# Patient Record
Sex: Male | Born: 1970 | Race: White | Hispanic: No | Marital: Married | Smoking: Former smoker
Health system: Southern US, Community
[De-identification: ages and names within clinical notes are randomized; demographics above are authoritative.]

## PROBLEM LIST (undated history)

## (undated) DIAGNOSIS — L509 Urticaria, unspecified: Secondary | ICD-10-CM

## (undated) DIAGNOSIS — J302 Other seasonal allergic rhinitis: Secondary | ICD-10-CM

## (undated) DIAGNOSIS — R519 Headache, unspecified: Secondary | ICD-10-CM

## (undated) DIAGNOSIS — E538 Deficiency of other specified B group vitamins: Secondary | ICD-10-CM

## (undated) DIAGNOSIS — Z87828 Personal history of other (healed) physical injury and trauma: Secondary | ICD-10-CM

## (undated) DIAGNOSIS — L309 Dermatitis, unspecified: Secondary | ICD-10-CM

## (undated) DIAGNOSIS — C61 Malignant neoplasm of prostate: Secondary | ICD-10-CM

## (undated) HISTORY — DX: Headache, unspecified: R51.9

## (undated) HISTORY — PX: OTHER SURGICAL HISTORY: SHX169

## (undated) HISTORY — PX: PROSTATE BIOPSY: SHX241

## (undated) HISTORY — DX: Deficiency of other specified B group vitamins: E53.8

## (undated) HISTORY — DX: Other seasonal allergic rhinitis: J30.2

## (undated) HISTORY — PX: HEMORRHOID SURGERY: SHX153

## (undated) HISTORY — DX: Personal history of other (healed) physical injury and trauma: Z87.828

## (undated) HISTORY — DX: Urticaria, unspecified: L50.9

## (undated) HISTORY — DX: Dermatitis, unspecified: L30.9

## (undated) NOTE — *Deleted (*Deleted)
 CC: Prostate Cancer    Ryan Manning is a 49 year old gentleman who was found to have an elevated PSA of 21.9 prompting a TRUS biopsy of the prostate by Dr. Winter on 12/21/19. This confirmed Gleason 3+4=7 adenocarcinoma with 1 out of 12 biopsy cores positive for malignancy.   Family history: None.   Imaging studies:  CT abdomen/pelvis (01/25/20): Negative for metastatic disease.  Bone scan (01/24/20): Negative for metastatic disease.   PMH: He has a history of GERD and anxiety.  PSH: Laparoscopic appendectomy   TNM stage: cT2a N0 M0 (L apex)  PSA: 21.9  Gleason score: 3+4=7 (GG 2)  Biopsy (12/21/19): 1/12 cores positive  Left: L lateral apex (20%, 3+4=7)  Right: Benign  Prostate volume: 22.2 cc   Nomogram  OC disease: 55%  EPE: 39%  SVI: 5%  LNI: 3%  PFS (5 year, 10 year): 73%, 58%   Urinary function: IPSS is 2.  Erectile function: SHIM score is 25.     ALLERGIES: None   MEDICATIONS: Hydrocodone-Acetaminophen 5 mg-325 mg tablet 1 tablet PO As Directed Take one hour prior to your scheduled prostate biopsy  Levaquin 750 mg tablet 1 tablet PO Daily As Directed Start taking the day before, the day of and the day after your prostate biopsy  Vitamin B12     GU PSH: Locm 300-399Mg/Ml Iodine,1Ml - 01/25/2020 Prostate Needle Biopsy - 12/21/2019     NON-GU PSH: Appendectomy Surgical Pathology, Gross And Microscopic Examination For Prostate Needle - 12/21/2019     GU PMH: Elevated PSA - 01/25/2020, - 10/24/2019    NON-GU PMH: Anxiety GERD Hypercholesterolemia    FAMILY HISTORY: Kidney Failure - Father Prostate Cancer - Father   SOCIAL HISTORY: Marital Status: Married Preferred Language: English; Race: White Current Smoking Status: Patient smokes. Smokes 1/2 pack per day.   Tobacco Use Assessment Completed: Used Tobacco in last 30 days? Drinks 12 drinks per week.  Drinks 3 caffeinated drinks per day.    REVIEW OF SYSTEMS:    GU Review Male:   Patient denies trouble  starting your streams, hard to postpone urination, burning/ pain with urination, frequent urination, have to strain to urinate , get up at night to urinate, stream starts and stops, and leakage of urine.  Gastrointestinal (Upper):   Patient denies nausea and vomiting.  Gastrointestinal (Lower):   Patient denies diarrhea and constipation.  Constitutional:   Patient denies fever, night sweats, weight loss, and fatigue.  Skin:   Patient denies skin rash/ lesion and itching.  Eyes:   Patient denies blurred vision and double vision.  Ears/ Nose/ Throat:   Patient denies sore throat and sinus problems.  Hematologic/Lymphatic:   Patient denies swollen glands and easy bruising.  Cardiovascular:   Patient denies leg swelling and chest pains.  Respiratory:   Patient denies cough and shortness of breath.  Endocrine:   Patient denies excessive thirst.  Musculoskeletal:   Patient denies back pain and joint pain.  Neurological:   Patient denies headaches and dizziness.  Psychologic:   Patient denies depression and anxiety.   VITAL SIGNS:     Weight 155 lb / 70.31 kg  Height 71 in / 180.34 cm  BMI 21.6 kg/m     MULTI-SYSTEM PHYSICAL EXAMINATION:    Constitutional: Well-nourished. No physical deformities. Normally developed. Good grooming.  Neck: Neck symmetrical, not swollen. Normal tracheal position.  Respiratory: No labored breathing, no use of accessory muscles. Clear bilaterally.  Cardiovascular: Normal temperature, normal extremity pulses,   no swelling, no varicosities. Regular rate and rhythm.  Lymphatic: No enlargement of neck, axillae, groin.  Skin: No paleness, no jaundice, no cyanosis. No lesion, no ulcer, no rash.  Neurologic / Psychiatric: Oriented to time, oriented to place, oriented to person. No depression, no anxiety, no agitation.  Gastrointestinal: No mass, no tenderness, no rigidity, non obese abdomen.  Eyes: Normal conjunctivae. Normal eyelids.  Ears, Nose, Mouth, and Throat:  Left ear no scars, no lesions, no masses. Right ear no scars, no lesions, no masses. Nose no scars, no lesions, no masses. Normal hearing. Normal lips.  Musculoskeletal: Normal gait and station of head and neck.     Complexity of Data:  Lab Test Review:   PSA  Records Review:   Pathology Reports, Previous Patient Records  X-Ray Review: C.T. Abdomen/Pelvis: Reviewed Films.  Bone Scan: Reviewed Films.     10/24/19  PSA  Total PSA 21.90 ng/mL   Notes:                     CLINICAL DATA: Prostate cancer diagnosed 12/21/2019. Elevated PSA.  Smoker. Appendectomy.   EXAM:  CT ABDOMEN AND PELVIS WITH CONTRAST   TECHNIQUE:  Multidetector CT imaging of the abdomen and pelvis was performed  using the standard protocol following bolus administration of  intravenous contrast.   CONTRAST: 100 cc Omnipaque 300   COMPARISON: 06/10/2016.   FINDINGS:  Lower chest: Clear lung bases. Normal heart size without pericardial  or pleural effusion.   Hepatobiliary: Enlargement of a segment 2-3 2.2 cm hepatic cyst.  Other hepatic lesions are too small to characterize but similar.  Normal gallbladder, without biliary ductal dilatation.   Pancreas: Normal, without mass or ductal dilatation.   Spleen: Normal in size, without focal abnormality.   Adrenals/Urinary Tract: Normal adrenal glands. Normal kidneys,  without hydronephrosis. Normal urinary bladder.   Stomach/Bowel: Proximal gastric underdistention. Normal colon and  terminal ileum. Appendectomy. Normal small bowel.   Vascular/Lymphatic: Aortic atherosclerosis. No abdominopelvic  adenopathy.   Reproductive: Normal sized prostate. Symmetric seminal vesicles.  Nonspecific subtle heterogeneous hyperenhancement in the left apex  on 73/2.   Other: Tiny periumbilical fat containing ventral wall hernia.   Musculoskeletal: No acute osseous abnormality. Transitional S1  vertebral body.   IMPRESSION:  1. No acute process or evidence of  metastatic disease in the abdomen  or pelvis.  2. Aortic Atherosclerosis (ICD10-I70.0).    Electronically Signed  By: Kyle Talbot M.D.  On: 01/25/2020 16:53   CLINICAL DATA: Prostate carcinoma   EXAM:  NUCLEAR MEDICINE WHOLE BODY BONE SCAN   TECHNIQUE:  Whole body anterior and posterior images were obtained approximately  3 hours after intravenous injection of radiopharmaceutical.   RADIOPHARMACEUTICALS: 21.3 millicurie mCi Technetium-99m MDP IV   COMPARISON: None.   FINDINGS:  There is no evidence of bony metastatic disease. There is probable  arthropathic change in the shoulders. Kidneys noted in the flank  positions bilaterally.   IMPRESSION:  No evident bony metastatic disease.    Electronically Signed  By: William Woodruff III M.D.  On: 01/26/2020 11:13   PROCEDURES:          Urinalysis Dipstick Dipstick Cont'd  Color: Yellow Bilirubin: Neg mg/dL  Appearance: Clear Ketones: Neg mg/dL  Specific Gravity: 1.030 Blood: Neg ery/uL  pH: 5.5 Protein: Neg mg/dL  Glucose: Neg mg/dL Urobilinogen: 0.2 mg/dL    Nitrites: Neg    Leukocyte Esterase: Neg leu/uL    ASSESSMENT:      ICD-10   Details  1 GU:   Prostate Cancer - C61    PLAN:             1. High-risk prostate cancer: I had a detailed discussion with Ryan Manning he today. He has numerous very good questions today and we reviewed his situation in detail including his risk stratification and his options for management. I did recommend therapy of curative intent considering his high risk disease. We specifically discussed his elevated PSA. This had been repeated and remained elevated. He understands that we do not have a great explanation for his PSA. We also discussed his exam which does raise some additional concerns today with nodularity toward the left lateral apex.   The patient was counseled about the natural history of prostate cancer and the standard treatment options that are available for prostate cancer.  It was explained to him how his age and life expectancy, clinical stage, Gleason score, and PSA affect his prognosis, the decision to proceed with additional staging studies, as well as how that information influences recommended treatment strategies. We discussed the roles for active surveillance, radiation therapy, surgical therapy, androgen deprivation, as well as ablative therapy options for the treatment of prostate cancer as appropriate to his individual cancer situation. We discussed the risks and benefits of these options with regard to their impact on cancer control and also in terms of potential adverse events, complications, and impact on quality of life particularly related to urinary and sexual function. The patient was encouraged to ask questions throughout the discussion today and all questions were answered to his stated satisfaction. In addition, the patient was provided with and/or directed to appropriate resources and literature for further education about prostate cancer and treatment options. We discussed surgical therapy for prostate cancer including the different available surgical approaches. We discussed, in detail, the risks and expectations of surgery with regard to cancer control, urinary control, and erectile function as well as the expected postoperative recovery process. Additional risks of surgery including but not limited to bleeding, infection, hernia formation, nerve damage, lymphocele formation, bowel/rectal injury potentially necessitating colostomy, damage to the urinary tract resulting in urine leakage, urethral stricture, and the cardiopulmonary risks such as myocardial infarction, stroke, death, venothromboembolism, etc. were explained. The risk of open surgical conversion for robotic/laparoscopic prostatectomy was also discussed.   He is well informed through his prior discussion with Dr. Manning and is going to follow up with Dr. Winter to review his options. He appears  to be leaning toward surgical therapy at this time.  Tentatively, I would plan to perform robot assisted laparoscopic radical prostatectomy and bilateral pelvic lymphadenectomy with decisions regarding nerve sparing pending his MRI should he choose to proceed in this fashion.   Cc: Dr. Lisa Miller  Dr. Christopher Winter  Dr. Matthew Manning          APPENDED NOTES:  Pt has made the decision to proceed with surgery. Will plan to schedule.      

---

## 2016-06-10 ENCOUNTER — Emergency Department (HOSPITAL_COMMUNITY): Payer: Self-pay | Admitting: Certified Registered Nurse Anesthetist

## 2016-06-10 ENCOUNTER — Encounter (HOSPITAL_BASED_OUTPATIENT_CLINIC_OR_DEPARTMENT_OTHER): Payer: Self-pay

## 2016-06-10 ENCOUNTER — Encounter (HOSPITAL_COMMUNITY)
Admission: EM | Disposition: A | Discharge status: Home Health | Payer: Self-pay | Source: Home / Self Care | Attending: Emergency Medicine

## 2016-06-10 ENCOUNTER — Observation Stay (HOSPITAL_BASED_OUTPATIENT_CLINIC_OR_DEPARTMENT_OTHER)
Admission: EM | Admit: 2016-06-10 | Discharge: 2016-06-11 | Disposition: A | Discharge status: Home Health | Payer: Self-pay | Attending: Surgery | Admitting: Surgery

## 2016-06-10 ENCOUNTER — Emergency Department (HOSPITAL_BASED_OUTPATIENT_CLINIC_OR_DEPARTMENT_OTHER): Payer: Self-pay

## 2016-06-10 DIAGNOSIS — K353 Acute appendicitis with localized peritonitis: Principal | ICD-10-CM | POA: Insufficient documentation

## 2016-06-10 DIAGNOSIS — Z720 Tobacco use: Secondary | ICD-10-CM | POA: Insufficient documentation

## 2016-06-10 DIAGNOSIS — K3580 Unspecified acute appendicitis: Secondary | ICD-10-CM

## 2016-06-10 DIAGNOSIS — K37 Unspecified appendicitis: Secondary | ICD-10-CM | POA: Diagnosis present

## 2016-06-10 DIAGNOSIS — F1290 Cannabis use, unspecified, uncomplicated: Secondary | ICD-10-CM | POA: Insufficient documentation

## 2016-06-10 DIAGNOSIS — K381 Appendicular concretions: Secondary | ICD-10-CM | POA: Insufficient documentation

## 2016-06-10 HISTORY — PX: LAPAROSCOPIC APPENDECTOMY: SHX408

## 2016-06-10 LAB — COMPREHENSIVE METABOLIC PANEL
ALT: 21 U/L (ref 17–63)
AST: 21 U/L (ref 15–41)
Albumin: 4.2 g/dL (ref 3.5–5.0)
Alkaline Phosphatase: 60 U/L (ref 38–126)
Anion gap: 4 — ABNORMAL LOW (ref 5–15)
BUN: 16 mg/dL (ref 6–20)
CO2: 31 mmol/L (ref 22–32)
Calcium: 9.1 mg/dL (ref 8.9–10.3)
Chloride: 103 mmol/L (ref 101–111)
Creatinine, Ser: 0.92 mg/dL (ref 0.61–1.24)
GFR calc Af Amer: >60 mL/min (ref >60)
GFR calc non Af Amer: >60 mL/min (ref >60)
Glucose, Bld: 87 mg/dL (ref 65–99)
Potassium: 4.5 mmol/L (ref 3.5–5.1)
Sodium: 138 mmol/L (ref 135–145)
Total Bilirubin: 0.5 mg/dL (ref 0.3–1.2)
Total Protein: 7.6 g/dL (ref 6.5–8.1)

## 2016-06-10 LAB — CBC
HCT: 47.1 % (ref 39.0–52.0)
Hemoglobin: 15.8 g/dL (ref 13.0–17.0)
MCH: 33 pg (ref 26.0–34.0)
MCHC: 33.5 g/dL (ref 30.0–36.0)
MCV: 98.3 fL (ref 78.0–100.0)
Platelets: 231 10*3/uL (ref 150–400)
RBC: 4.79 MIL/uL (ref 4.22–5.81)
RDW: 13.4 % (ref 11.5–15.5)
WBC: 11 10*3/uL — ABNORMAL HIGH (ref 4.0–10.5)

## 2016-06-10 LAB — URINALYSIS, ROUTINE W REFLEX MICROSCOPIC
Bilirubin Urine: NEGATIVE
Glucose, UA: NEGATIVE mg/dL
Hgb urine dipstick: NEGATIVE
Ketones, ur: NEGATIVE mg/dL
Leukocytes, UA: NEGATIVE
Nitrite: NEGATIVE
Protein, ur: NEGATIVE mg/dL
Specific Gravity, Urine: 1.024 (ref 1.005–1.030)
pH: 6 (ref 5.0–8.0)

## 2016-06-10 LAB — LIPASE, BLOOD: Lipase: 24 U/L (ref 11–51)

## 2016-06-10 SURGERY — APPENDECTOMY, LAPAROSCOPIC
Anesthesia: General

## 2016-06-10 MED ORDER — HEPARIN SODIUM (PORCINE) 5000 UNIT/ML IJ SOLN
5000.0000 [IU] | Freq: Three times a day (TID) | INTRAMUSCULAR | Status: DC
  Administered 2016-06-10 – 2016-06-11 (×2): 5000 [IU] via SUBCUTANEOUS
  Filled 2016-06-10 (×2): qty 1

## 2016-06-10 MED ORDER — MIDAZOLAM HCL 2 MG/2ML IJ SOLN
INTRAMUSCULAR | Status: AC
  Filled 2016-06-10: qty 2

## 2016-06-10 MED ORDER — PROPOFOL 10 MG/ML IV BOLUS
INTRAVENOUS | Status: DC | PRN
  Administered 2016-06-10: 200 mg via INTRAVENOUS

## 2016-06-10 MED ORDER — ROCURONIUM BROMIDE 10 MG/ML (PF) SYRINGE
PREFILLED_SYRINGE | INTRAVENOUS | Status: DC | PRN
  Administered 2016-06-10: 40 mg via INTRAVENOUS

## 2016-06-10 MED ORDER — PHENYLEPHRINE 40 MCG/ML (10ML) SYRINGE FOR IV PUSH (FOR BLOOD PRESSURE SUPPORT)
PREFILLED_SYRINGE | INTRAVENOUS | Status: DC | PRN
  Administered 2016-06-10: 40 ug via INTRAVENOUS
  Administered 2016-06-10: 80 ug via INTRAVENOUS

## 2016-06-10 MED ORDER — PIPERACILLIN-TAZOBACTAM 3.375 G IVPB 30 MIN
3.3750 g | Freq: Once | INTRAVENOUS | Status: AC
  Administered 2016-06-10: 3.375 g via INTRAVENOUS
  Filled 2016-06-10: qty 50

## 2016-06-10 MED ORDER — LACTATED RINGERS IV SOLN
INTRAVENOUS | Status: DC | PRN
  Administered 2016-06-10 (×2): via INTRAVENOUS

## 2016-06-10 MED ORDER — ROCURONIUM BROMIDE 50 MG/5ML IV SOSY
PREFILLED_SYRINGE | INTRAVENOUS | Status: AC
  Filled 2016-06-10: qty 5

## 2016-06-10 MED ORDER — HYDROCODONE-ACETAMINOPHEN 5-325 MG PO TABS: 1.0000 | ORAL_TABLET | ORAL | Status: DC | PRN

## 2016-06-10 MED ORDER — IOPAMIDOL (ISOVUE-300) INJECTION 61%
100.0000 mL | Freq: Once | INTRAVENOUS | Status: AC | PRN
  Administered 2016-06-10: 100 mL via INTRAVENOUS

## 2016-06-10 MED ORDER — PROMETHAZINE HCL 25 MG/ML IJ SOLN: 6.2500 mg | INTRAMUSCULAR | Status: DC | PRN

## 2016-06-10 MED ORDER — ONDANSETRON HCL 4 MG/2ML IJ SOLN
INTRAMUSCULAR | Status: DC | PRN
Start: 2016-06-10 — End: 2016-06-10
  Administered 2016-06-10: 4 mg via INTRAVENOUS

## 2016-06-10 MED ORDER — SUGAMMADEX SODIUM 200 MG/2ML IV SOLN
INTRAVENOUS | Status: DC | PRN
  Administered 2016-06-10: 200 mg via INTRAVENOUS

## 2016-06-10 MED ORDER — HYDROMORPHONE HCL 1 MG/ML IJ SOLN
INTRAMUSCULAR | Status: AC
  Filled 2016-06-10: qty 1

## 2016-06-10 MED ORDER — KETOROLAC TROMETHAMINE 30 MG/ML IJ SOLN
30.0000 mg | Freq: Once | INTRAMUSCULAR | Status: AC | PRN
  Administered 2016-06-10: 30 mg via INTRAVENOUS

## 2016-06-10 MED ORDER — PIPERACILLIN-TAZOBACTAM 3.375 G IVPB
3.3750 g | Freq: Three times a day (TID) | INTRAVENOUS | Status: DC
  Administered 2016-06-11: 3.375 g via INTRAVENOUS
  Filled 2016-06-10: qty 50

## 2016-06-10 MED ORDER — BUPIVACAINE HCL (PF) 0.25 % IJ SOLN
INTRAMUSCULAR | Status: AC
  Filled 2016-06-10: qty 30

## 2016-06-10 MED ORDER — MIDAZOLAM HCL 5 MG/5ML IJ SOLN
INTRAMUSCULAR | Status: DC | PRN
  Administered 2016-06-10: 2 mg via INTRAVENOUS

## 2016-06-10 MED ORDER — LACTATED RINGERS IR SOLN
Status: DC | PRN
  Administered 2016-06-10: 1000 mL

## 2016-06-10 MED ORDER — SODIUM CHLORIDE 0.9 % IV BOLUS (SEPSIS)
1000.0000 mL | Freq: Once | INTRAVENOUS | Status: AC
  Administered 2016-06-10: 1000 mL via INTRAVENOUS

## 2016-06-10 MED ORDER — FENTANYL CITRATE (PF) 100 MCG/2ML IJ SOLN
INTRAMUSCULAR | Status: DC | PRN
  Administered 2016-06-10: 100 ug via INTRAVENOUS
  Administered 2016-06-10: 50 ug via INTRAVENOUS

## 2016-06-10 MED ORDER — LIDOCAINE 2% (20 MG/ML) 5 ML SYRINGE
INTRAMUSCULAR | Status: AC
  Filled 2016-06-10: qty 5

## 2016-06-10 MED ORDER — SUCCINYLCHOLINE CHLORIDE 200 MG/10ML IV SOSY
PREFILLED_SYRINGE | INTRAVENOUS | Status: AC
  Filled 2016-06-10: qty 10

## 2016-06-10 MED ORDER — KETOROLAC TROMETHAMINE 30 MG/ML IJ SOLN
INTRAMUSCULAR | Status: AC
  Filled 2016-06-10: qty 1

## 2016-06-10 MED ORDER — PIPERACILLIN-TAZOBACTAM 4.5 G IVPB
4.5000 g | Freq: Once | INTRAVENOUS | Status: DC
  Filled 2016-06-10: qty 100

## 2016-06-10 MED ORDER — ONDANSETRON HCL 4 MG/2ML IJ SOLN
INTRAMUSCULAR | Status: AC
  Filled 2016-06-10: qty 2

## 2016-06-10 MED ORDER — PIPERACILLIN-TAZOBACTAM 3.375 G IVPB
INTRAVENOUS | Status: AC
  Administered 2016-06-10: 3.375 g via INTRAVENOUS
  Filled 2016-06-10: qty 50

## 2016-06-10 MED ORDER — KCL IN DEXTROSE-NACL 20-5-0.45 MEQ/L-%-% IV SOLN
INTRAVENOUS | Status: DC
  Administered 2016-06-10: via INTRAVENOUS
  Filled 2016-06-10 (×2): qty 1000

## 2016-06-10 MED ORDER — MORPHINE SULFATE (PF) 2 MG/ML IV SOLN: 1.0000 mg | INTRAVENOUS | Status: DC | PRN

## 2016-06-10 MED ORDER — HYDROMORPHONE HCL 1 MG/ML IJ SOLN
0.2500 mg | INTRAMUSCULAR | Status: DC | PRN
  Administered 2016-06-10 (×2): 0.5 mg via INTRAVENOUS

## 2016-06-10 MED ORDER — SUCCINYLCHOLINE CHLORIDE 20 MG/ML IJ SOLN
INTRAMUSCULAR | Status: DC | PRN
  Administered 2016-06-10: 120 mg via INTRAVENOUS

## 2016-06-10 MED ORDER — BUPIVACAINE HCL 0.25 % IJ SOLN
INTRAMUSCULAR | Status: DC | PRN
  Administered 2016-06-10: 20 mL

## 2016-06-10 MED ORDER — PROPOFOL 10 MG/ML IV BOLUS
INTRAVENOUS | Status: AC
  Filled 2016-06-10: qty 20

## 2016-06-10 MED ORDER — PIPERACILLIN-TAZOBACTAM 3.375 G IVPB
INTRAVENOUS | Status: AC
  Filled 2016-06-10: qty 50

## 2016-06-10 MED ORDER — SUGAMMADEX SODIUM 200 MG/2ML IV SOLN
INTRAVENOUS | Status: AC
  Filled 2016-06-10: qty 2

## 2016-06-10 MED ORDER — LIDOCAINE 2% (20 MG/ML) 5 ML SYRINGE
INTRAMUSCULAR | Status: DC | PRN
  Administered 2016-06-10: 50 mg via INTRAVENOUS

## 2016-06-10 MED ORDER — PIPERACILLIN-TAZOBACTAM 3.375 G IVPB 30 MIN
3.3750 g | Freq: Once | INTRAVENOUS | Status: AC
  Administered 2016-06-10: 3.375 g via INTRAVENOUS

## 2016-06-10 MED ORDER — ONDANSETRON HCL 4 MG/2ML IJ SOLN: 4.0000 mg | Freq: Four times a day (QID) | INTRAMUSCULAR | Status: DC | PRN

## 2016-06-10 MED ORDER — ONDANSETRON 4 MG PO TBDP: 4.0000 mg | ORAL_TABLET | Freq: Four times a day (QID) | ORAL | Status: DC | PRN

## 2016-06-10 MED ORDER — FENTANYL CITRATE (PF) 100 MCG/2ML IJ SOLN
INTRAMUSCULAR | Status: AC
  Filled 2016-06-10: qty 2

## 2016-06-10 SURGICAL SUPPLY — 34 items
APPLIER CLIP ROT 10 11.4 M/L (STAPLE)
CABLE HIGH FREQUENCY MONO STRZ (ELECTRODE) IMPLANT
CLIP APPLIE ROT 10 11.4 M/L (STAPLE) IMPLANT
COVER SURGICAL LIGHT HANDLE (MISCELLANEOUS) ×3 IMPLANT
CUTTER FLEX LINEAR 45M (STAPLE) ×3 IMPLANT
DRAPE LAPAROSCOPIC ABDOMINAL (DRAPES) ×3 IMPLANT
ELECT REM PT RETURN 9FT ADLT (ELECTROSURGICAL) ×3
ELECTRODE REM PT RTRN 9FT ADLT (ELECTROSURGICAL) ×1 IMPLANT
ENDOLOOP SUT PDS II  0 18 (SUTURE)
ENDOLOOP SUT PDS II 0 18 (SUTURE) IMPLANT
GLOVE BIOGEL M 8.0 STRL (GLOVE) ×3 IMPLANT
GOWN STRL REUS W/TWL XL LVL3 (GOWN DISPOSABLE) ×3 IMPLANT
IRRIG SUCT STRYKERFLOW 2 WTIP (MISCELLANEOUS) ×3
IRRIGATION SUCT STRKRFLW 2 WTP (MISCELLANEOUS) ×1 IMPLANT
KIT BASIN OR (CUSTOM PROCEDURE TRAY) ×3 IMPLANT
LIQUID BAND (GAUZE/BANDAGES/DRESSINGS) ×3 IMPLANT
POUCH RETRIEVAL ECOSAC 10 (ENDOMECHANICALS) IMPLANT
POUCH RETRIEVAL ECOSAC 10MM (ENDOMECHANICALS)
POUCH SPECIMEN RETRIEVAL 10MM (ENDOMECHANICALS) IMPLANT
RELOAD 45 VASCULAR/THIN (ENDOMECHANICALS) ×6 IMPLANT
RELOAD STAPLE TA45 3.5 REG BLU (ENDOMECHANICALS) IMPLANT
SCISSORS LAP 5X45 EPIX DISP (ENDOMECHANICALS) IMPLANT
SHEARS HARMONIC ACE PLUS 45CM (MISCELLANEOUS) ×3 IMPLANT
SLEEVE XCEL OPT CAN 5 100 (ENDOMECHANICALS) IMPLANT
STAPLER VISISTAT 35W (STAPLE) IMPLANT
SUT VIC AB 4-0 SH 18 (SUTURE) ×3 IMPLANT
SUT VICRYL 0 UR6 27IN ABS (SUTURE) ×3 IMPLANT
TOWEL OR 17X26 10 PK STRL BLUE (TOWEL DISPOSABLE) ×3 IMPLANT
TRAY FOLEY W/METER SILVER 16FR (SET/KITS/TRAYS/PACK) ×3 IMPLANT
TRAY LAPAROSCOPIC (CUSTOM PROCEDURE TRAY) ×3 IMPLANT
TROCAR BLADELESS OPT 5 100 (ENDOMECHANICALS) ×3 IMPLANT
TROCAR XCEL BLUNT TIP 100MML (ENDOMECHANICALS) ×3 IMPLANT
TROCAR XCEL NON-BLD 11X100MML (ENDOMECHANICALS) IMPLANT
TUBING INSUF HEATED (TUBING) ×3 IMPLANT

## 2016-06-10 NOTE — ED Notes (Signed)
Pt d/c via POV to the [facility] ED for surgical consult. Pt verbalized understanding of where to go. Pt instructed not to tamper with IV, not to make any additional stops, and not to eat or drink.

## 2016-06-10 NOTE — Anesthesia Preprocedure Evaluation (Signed)
Anesthesia Evaluation  Patient identified by MRN, date of birth, ID band Patient awake    Reviewed: Allergy & Precautions, NPO status , Patient's Chart, lab work & pertinent test results  Airway Mallampati: II  TM Distance: >3 FB Neck ROM: Full    Dental  (+) Dental Advisory Given   Pulmonary Current Smoker,    breath sounds clear to auscultation       Cardiovascular negative cardio ROS   Rhythm:Regular Rate:Normal     Neuro/Psych negative neurological ROS     GI/Hepatic Neg liver ROS, Acute appendicitis.   Endo/Other  negative endocrine ROS  Renal/GU negative Renal ROS     Musculoskeletal   Abdominal   Peds  Hematology negative hematology ROS (+)   Anesthesia Other Findings   Reproductive/Obstetrics                             Anesthesia Physical Anesthesia Plan  ASA: II and emergent  Anesthesia Plan: General   Post-op Pain Management:    Induction: Intravenous and Rapid sequence  Airway Management Planned: Oral ETT  Additional Equipment:   Intra-op Plan:   Post-operative Plan:   Informed Consent: I have reviewed the patients History and Physical, chart, labs and discussed the procedure including the risks, benefits and alternatives for the proposed anesthesia with the patient or authorized representative who has indicated his/her understanding and acceptance.   Dental advisory given  Plan Discussed with: CRNA  Anesthesia Plan Comments:         Anesthesia Quick Evaluation

## 2016-06-10 NOTE — Anesthesia Procedure Notes (Signed)
Procedure Name: Intubation Performed by: Gean Maidens Pre-anesthesia Checklist: Patient identified, Emergency Drugs available, Suction available, Patient being monitored and Timeout performed Patient Re-evaluated:Patient Re-evaluated prior to inductionOxygen Delivery Method: Circle system utilized Preoxygenation: Pre-oxygenation with 100% oxygen Intubation Type: IV induction Ventilation: Mask ventilation without difficulty Laryngoscope Size: Mac and 3 Grade View: Grade I Tube type: Oral Tube size: 7.5 mm Number of attempts: 1 Airway Equipment and Method: Stylet Placement Confirmation: ETT inserted through vocal cords under direct vision,  positive ETCO2,  CO2 detector and breath sounds checked- equal and bilateral Secured at: 23 cm Tube secured with: Tape Dental Injury: Teeth and Oropharynx as per pre-operative assessment

## 2016-06-10 NOTE — ED Provider Notes (Signed)
Patient accepted in transfer from Dr. Canary Brim at outside facility. Patient found to have appendicitis at outside facility. Patient transferred to see Dr. Hassell Done with general surgery.  Upon arrival, Dr. Hassell Done evaluated the patient.  Dr. Hassell Done will take the patient directly to surgery and admit after. Pt left to OR without complication in the ED.   Clinical Impression: 1. Acute appendicitis, unspecified acute appendicitis type     Disposition: Admit to Surgery after OR     Courtney Paris, MD 06/11/16 1140

## 2016-06-10 NOTE — Op Note (Addendum)
Surgeon: Kaylyn Lim, MD, FACS  Asst:  none  Anes:  general  Preop Dx: appendicitis Postop Dx: Acute appendicitis-not ruptured  Procedure: Laparoscopic appendectomy Location Surgery: [facility] Rm 2 Complications: None   EBL:   5 cc  Drains: none  Description of Procedure:  The patient was taken to OR 2 .  After anesthesia was administered and the patient was prepped a timeout was performed.  Access achieved with umbilical Hasson placement.  Two five mm trocars placed in the right upper quadrant and the left lower quadrant.  Slight purulent drainage in the area of the cecum.    The mesentery of the appendix was divided with a Harmonic scalpel.  The base was isolated and transected with two applications of the Endostapler 4.5 with vascular load.  The appendix was placed in a bag and brought out through the umbilicus. The umbilical port was closed with a fig of 8 of 0 vicryl.    Ports were injected with Marcaine.  Prior to closing the area was irrigated and inspected and no bleeding was noted.  The fluid was aspirated from the pelvis and over the liver.  The gallbladder was distended and robin's egg blue.  Pneumo released.   Incisions were closed with Vicryl and Dermabond.    The patient tolerated the procedure well and was taken to the PACU in stable condition.     Matt B. Hassell Done, [place], [facility], [place]

## 2016-06-10 NOTE — Transfer of Care (Signed)
Immediate Anesthesia Transfer of Care Note  Patient: Ryan Manning  Procedure(s) Performed: Procedure(s): APPENDECTOMY LAPAROSCOPIC (N/A)  Patient Location: PACU  Anesthesia Type:General  Level of Consciousness: awake, alert  and oriented  Airway & Oxygen Therapy: Patient Spontanous Breathing and Patient connected to face mask oxygen  Post-op Assessment: Report given to RN and Post -op Vital signs reviewed and stable  Post vital signs: Reviewed and stable  Last Vitals:  Vitals:   06/10/16 1719 06/10/16 1857  BP: 127/95 117/81  Pulse: 73 98  Resp: 18 14  Temp:  36.8 C    Last Pain:  Vitals:   06/10/16 1857  TempSrc: Oral  PainSc: 2          Complications: No apparent anesthesia complications

## 2016-06-10 NOTE — H&P (Signed)
Chief Complaint:  Right lower quadrant pain  History of Present Illness:  Ryan Manning is an 46 y.o. male who just moved here from [place], [place].  He presented to [facility] with 2 day history of gradually worsening abdominal pain localizing in the right lower quadrant.  CT showed appendicolith and acute appendicitis  History reviewed. No pertinent past medical history.  Past Surgical History:  Procedure Laterality Date  . HEMORRHOID SURGERY      No current facility-administered medications for this encounter.    No current outpatient prescriptions on file.   Patient has no known allergies. No family history on file. Social History:   reports that he has been smoking.  He has never used smokeless tobacco. He reports that he drinks alcohol. He reports that he uses drugs, including Marijuana.   REVIEW OF SYSTEMS : Negative except hemorroidectomy  Physical Exam:   Blood pressure 117/81, pulse 98, temperature 98.2 F (36.8 C), temperature source Oral, resp. rate 14, height 5' 10" (1.778 m), weight 73 kg (161 lb), SpO2 98 %. Body mass index is 23.1 kg/m.  Gen:  WDWN WM NAD  Neurological: Alert and oriented to person, place, and time. Motor and sensory function is grossly intact  Head: Normocephalic and atraumatic.  Eyes: Conjunctivae are normal. Pupils are equal, round, and reactive to light. No scleral icterus.  Neck: Normal range of motion. Neck supple. No tracheal deviation or thyromegaly present.  Cardiovascular:  SR without murmurs or gallops.  No carotid bruits Breast:  Not examined Respiratory: Effort normal.  No respiratory distress. No chest wall tenderness. Breath sounds normal.  No wheezes, rales or rhonchi.  Abdomen:  Right lower quadrant abdominal pain GU:  unremarkable Musculoskeletal: Normal range of motion. Extremities are nontender. No cyanosis, edema or clubbing noted Lymphadenopathy: No cervical, preauricular, postauricular or axillary adenopathy is  present Skin: Skin is warm and dry. No rash noted. No diaphoresis. No erythema. No pallor. Pscyh: Normal mood and affect. Behavior is normal. Judgment and thought content normal.   LABORATORY RESULTS: Results for orders placed or performed during the hospital encounter of 06/10/16 (from the past 48 hour(s))  Lipase, blood     Status: None   Collection Time: 06/10/16  3:30 PM  Result Value Ref Range   Lipase 24 11 - 51 U/L  Comprehensive metabolic panel     Status: Abnormal   Collection Time: 06/10/16  3:30 PM  Result Value Ref Range   Sodium 138 135 - 145 mmol/L   Potassium 4.5 3.5 - 5.1 mmol/L   Chloride 103 101 - 111 mmol/L   CO2 31 22 - 32 mmol/L   Glucose, Bld 87 65 - 99 mg/dL   BUN 16 6 - 20 mg/dL   Creatinine, Ser 0.92 0.61 - 1.24 mg/dL   Calcium 9.1 8.9 - 10.3 mg/dL   Total Protein 7.6 6.5 - 8.1 g/dL   Albumin 4.2 3.5 - 5.0 g/dL   AST 21 15 - 41 U/L   ALT 21 17 - 63 U/L   Alkaline Phosphatase 60 38 - 126 U/L   Total Bilirubin 0.5 0.3 - 1.2 mg/dL   GFR calc non Af Amer >60 >60 mL/min   GFR calc Af Amer >60 >60 mL/min    Comment: (NOTE) The eGFR has been calculated using the CKD EPI equation. This calculation has not been validated in all clinical situations. eGFR's persistently <60 mL/min signify possible Chronic Kidney Disease.    Anion gap 4 (L) 5 -  15  CBC     Status: Abnormal   Collection Time: 06/10/16  3:30 PM  Result Value Ref Range   WBC 11.0 (H) 4.0 - 10.5 K/uL   RBC 4.79 4.22 - 5.81 MIL/uL   Hemoglobin 15.8 13.0 - 17.0 g/dL   HCT 47.1 39.0 - 52.0 %   MCV 98.3 78.0 - 100.0 fL   MCH 33.0 26.0 - 34.0 pg   MCHC 33.5 30.0 - 36.0 g/dL   RDW 13.4 11.5 - 15.5 %   Platelets 231 150 - 400 K/uL  Urinalysis, Routine w reflex microscopic     Status: None   Collection Time: 06/10/16  5:20 PM  Result Value Ref Range   Color, Urine YELLOW YELLOW   APPearance CLEAR CLEAR   Specific Gravity, Urine 1.024 1.005 - 1.030   pH 6.0 5.0 - 8.0   Glucose, UA NEGATIVE  NEGATIVE mg/dL   Hgb urine dipstick NEGATIVE NEGATIVE   Bilirubin Urine NEGATIVE NEGATIVE   Ketones, ur NEGATIVE NEGATIVE mg/dL   Protein, ur NEGATIVE NEGATIVE mg/dL   Nitrite NEGATIVE NEGATIVE   Leukocytes, UA NEGATIVE NEGATIVE    Comment: Microscopic not done on urines with negative protein, blood, leukocytes, nitrite, or glucose < 500 mg/dL.     RADIOLOGY RESULTS: Ct Abdomen Pelvis W Contrast  Result Date: 06/10/2016 CLINICAL DATA:  46 year old male with right lower quadrant abdominal pain since yesterday. Initial encounter. EXAM: CT ABDOMEN AND PELVIS WITH CONTRAST TECHNIQUE: Multidetector CT imaging of the abdomen and pelvis was performed using the standard protocol following bolus administration of intravenous contrast. CONTRAST:  143m ISOVUE-300 IOPAMIDOL (ISOVUE-300) INJECTION 61% COMPARISON:  None. FINDINGS: Lower chest: Normal lung bases.  No pericardial or pleural effusion. Hepatobiliary: Benign-appearing 10 mm low-density area in the left hepatic lobe (series 2, image 17), probably a small cyst. Similar but smaller hypodensity in the caudate lobe. Otherwise normal liver and gallbladder. Pancreas: Negative. Spleen: Negative. Adrenals/Urinary Tract: Normal adrenal glands. Bilateral renal enhancement an contrast excretion is normal. Diminutive urinary bladder.  Occasional pelvic phleboliths. Stomach/Bowel: Decompressed distal large bowel. Negative splenic flexure, transverse colon and hepatic flexure. The ascending colon is within normal limits. There is an elongated appendicolith within the base of the appendix (coronal images 35 through 45) with acute appendicitis. The appendix is dilated (16 mm), fluid-filled, and hyperenhancing with mild adjacent soft tissue stranding. There might be mild secondary inflammation of the terminal ileum which is also mildly hyperenhancing tracking back into the distal small bowel. Oral contrast was administered and has reached the mid jejunum. No dilated  small bowel loops. Negative stomach and duodenum. No abdominal free fluid.  No extraluminal gas. Vascular/Lymphatic: Major arterial structures throughout the abdomen and pelvis are patent. Portal venous system is patent. No lymphadenopathy. Reproductive: Negative; mild ectasia of the seminal vesicles and dystrophic calcifications of the prostate. Other: No pelvic free fluid. Musculoskeletal: Transitional lumbosacral anatomy. No acute osseous abnormality identified. IMPRESSION: 1. Acute Appendicitis with an elongated appendicolith noted at the base of the appendix. No abscess or evidence of rupture. 2. Secondary inflammation of the distal small bowel suspected. Electronically Signed   By: HGenevie AnnM.D.   On: 06/10/2016 17:13    Problem List: Patient Active Problem List   Diagnosis Date Noted  . Appendicitis 06/10/2016    Assessment & Plan: Acute appendicitis I have discussed lap and open appendectomy with him and his wife.      Matt B. MHassell Done MD, FMem[facility] [phone] [phone]  06/10/2016 8:27 PM     

## 2016-06-10 NOTE — ED Notes (Signed)
Bed: WA03 Expected date:  Expected time:  Means of arrival:  Comments: Tx from [facility]

## 2016-06-10 NOTE — ED Triage Notes (Signed)
C/o RLQ pain stared yesterday-NAD-steady gait

## 2016-06-10 NOTE — ED Provider Notes (Signed)
[facility] DEPT Provider Note   CSN: [id number] Arrival date & time: 06/10/16  1448     History   Chief Complaint Chief Complaint  Patient presents with  . Abdominal Pain    HPI Ryan Manning is a 46 y.o. male.  HPI  Pt presenting with c/o right lower abdominal pain.  Pt states pain began approx 2 days ago- he felt some bloating.  Then over the past 2 days he has had increased pain in right lower abdomen.  He had some pain on the drive to the ED.  No fever/chills.  No nausea or vomiting.  He has had decreased appetite today.  Some increased fatigue today.  No dysuria, no testicular pain.  There are no other associated systemic symptoms, there are no other alleviating or modifying factors.   History reviewed. No pertinent past medical history.  Patient Active Problem List   Diagnosis Date Noted  . Appendicitis s/p laparoscopic appendectomy 06/10/16 06/10/2016    Past Surgical History:  Procedure Laterality Date  . HEMORRHOID SURGERY    . LAPAROSCOPIC APPENDECTOMY N/A 06/10/2016   Procedure: APPENDECTOMY LAPAROSCOPIC;  Surgeon: Johnathan Hausen, MD;  Location: [facility];  Service: General;  Laterality: N/A;       Home Medications    Prior to Admission medications   Medication Sig Start Date End Date Taking? Authorizing Provider  cetirizine (ZYRTEC) 10 MG tablet Take 10 mg by mouth daily as needed for allergies.   Yes Historical Provider, MD  HYDROcodone-acetaminophen (NORCO/VICODIN) 5-325 MG tablet Take 1-2 tablets by mouth every 4 (four) hours as needed for moderate pain. 06/11/16   Jackolyn Confer, MD    Family History No family history on file.  Social History Social History  Substance Use Topics  . Smoking status: Current Every Day Smoker  . Smokeless tobacco: Never Used  . Alcohol use Yes     Comment: occ     Allergies   Patient has no known allergies.   Review of Systems Review of Systems  ROS reviewed and all otherwise negative except for mentioned in  HPI   Physical Exam Updated Vital Signs BP 113/79 (BP Location: Left Arm)   Pulse 71   Temp 98.6 F (37 C) (Oral)   Resp 16   Ht 5\' 10"  (1.778 m)   Wt 73 kg   SpO2 99%   BMI 23.10 kg/m  Vitals reviewed Physical Exam Physical Examination: General appearance - alert, well appearing, and in no distress Mental status - alert, oriented to person, place, and time Eyes - no conjunctival injection, no scleral icterus Mouth - mucous membranes moist, pharynx normal without lesions Chest - clear to auscultation, no wheezes, rales or rhonchi, symmetric air entry Heart - normal rate, regular rhythm, normal S1, S2, no murmurs, rubs, clicks or gallops Abdomen - soft, ttp at mcburney's point with involuntary gaurding, + rebound tenderness, nabs, nondistended, no masses or organomegaly Neurological - alert, oriented, normal speech Extremities - peripheral pulses normal, no pedal edema, no clubbing or cyanosis Skin - normal coloration and turgor, no rashes  ED Treatments / Results  Labs (all labs ordered are listed, but only abnormal results are displayed) Labs Reviewed  COMPREHENSIVE METABOLIC PANEL - Abnormal; Notable for the following:       Result Value   Anion gap 4 (*)    All other components within normal limits  CBC - Abnormal; Notable for the following:    WBC 11.0 (*)    All other components within normal  limits  CBC - Abnormal; Notable for the following:    WBC 12.1 (*)    All other components within normal limits  CBC - Abnormal; Notable for the following:    WBC 11.1 (*)    RBC 4.14 (*)    All other components within normal limits  LIPASE, BLOOD  URINALYSIS, ROUTINE W REFLEX MICROSCOPIC  CREATININE, SERUM  SURGICAL PATHOLOGY    EKG  EKG Interpretation None       Radiology Ct Abdomen Pelvis W Contrast  Result Date: 06/10/2016 CLINICAL DATA:  46 year old male with right lower quadrant abdominal pain since yesterday. Initial encounter. EXAM: CT ABDOMEN AND PELVIS  WITH CONTRAST TECHNIQUE: Multidetector CT imaging of the abdomen and pelvis was performed using the standard protocol following bolus administration of intravenous contrast. CONTRAST:  122mL ISOVUE-300 IOPAMIDOL (ISOVUE-300) INJECTION 61% COMPARISON:  None. FINDINGS: Lower chest: Normal lung bases.  No pericardial or pleural effusion. Hepatobiliary: Benign-appearing 10 mm low-density area in the left hepatic lobe (series 2, image 17), probably a small cyst. Similar but smaller hypodensity in the caudate lobe. Otherwise normal liver and gallbladder. Pancreas: Negative. Spleen: Negative. Adrenals/Urinary Tract: Normal adrenal glands. Bilateral renal enhancement an contrast excretion is normal. Diminutive urinary bladder.  Occasional pelvic phleboliths. Stomach/Bowel: Decompressed distal large bowel. Negative splenic flexure, transverse colon and hepatic flexure. The ascending colon is within normal limits. There is an elongated appendicolith within the base of the appendix (coronal images 35 through 45) with acute appendicitis. The appendix is dilated (16 mm), fluid-filled, and hyperenhancing with mild adjacent soft tissue stranding. There might be mild secondary inflammation of the terminal ileum which is also mildly hyperenhancing tracking back into the distal small bowel. Oral contrast was administered and has reached the mid jejunum. No dilated small bowel loops. Negative stomach and duodenum. No abdominal free fluid.  No extraluminal gas. Vascular/Lymphatic: Major arterial structures throughout the abdomen and pelvis are patent. Portal venous system is patent. No lymphadenopathy. Reproductive: Negative; mild ectasia of the seminal vesicles and dystrophic calcifications of the prostate. Other: No pelvic free fluid. Musculoskeletal: Transitional lumbosacral anatomy. No acute osseous abnormality identified. IMPRESSION: 1. Acute Appendicitis with an elongated appendicolith noted at the base of the appendix. No  abscess or evidence of rupture. 2. Secondary inflammation of the distal small bowel suspected. Electronically Signed   By: Genevie Ann M.D.   On: 06/10/2016 17:13    Procedures Procedures (including critical care time)  Medications Ordered in ED Medications  HYDROmorphone (DILAUDID) 1 MG/ML injection (  Not Given 06/10/16 2333)  ketorolac (TORADOL) 30 MG/ML injection (  Not Given 06/10/16 2334)  sodium chloride 0.9 % bolus 1,000 mL (0 mLs Intravenous Stopped 06/10/16 1801)  iopamidol (ISOVUE-300) 61 % injection 100 mL (100 mLs Intravenous Contrast Given 06/10/16 1657)  piperacillin-tazobactam (ZOSYN) IVPB 3.375 g (0 g Intravenous Stopped 06/10/16 1831)  ketorolac (TORADOL) 30 MG/ML injection 30 mg (30 mg Intravenous Given 06/10/16 2248)  piperacillin-tazobactam (ZOSYN) IVPB 3.375 g (3.375 g Intravenous Given 06/10/16 2129)     Initial Impression / Assessment and Plan / ED Course  I have reviewed the triage vital signs and the nursing notes.  Pertinent labs & imaging results that were available during my care of the patient were reviewed by me and considered in my medical decision making (see chart for details).    5:47 PM d/w Dr. Hassell Done, [place], he will see patient in the ED at [facility],  [place] to go private vehicle after getting a dose of  zosyn in the ED.    6:09 PM d/w Dr. Sherry Ruffing at [facility] ED who is aware that patient is coming to the ED and Dr. Hassell Done should be called on arrival.     Final Clinical Impressions(s) / ED Diagnoses   Final diagnoses:  Acute appendicitis, unspecified acute appendicitis type    New Prescriptions Discharge Medication List as of 06/11/2016 11:46 AM    START taking these medications   Details  HYDROcodone-acetaminophen (NORCO/VICODIN) 5-325 MG tablet Take 1-2 tablets by mouth every 4 (four) hours as needed for moderate pain., Starting Thu 06/11/2016, Print         Alfonzo Beers, MD 06/11/16 2134

## 2016-06-11 ENCOUNTER — Encounter (HOSPITAL_COMMUNITY): Payer: Self-pay | Admitting: Surgery

## 2016-06-11 LAB — CBC
HCT: 40.5 % (ref 39.0–52.0)
HCT: 41.4 % (ref 39.0–52.0)
Hemoglobin: 13.4 g/dL (ref 13.0–17.0)
Hemoglobin: 14 g/dL (ref 13.0–17.0)
MCH: 32.2 pg (ref 26.0–34.0)
MCH: 32.4 pg (ref 26.0–34.0)
MCHC: 33.1 g/dL (ref 30.0–36.0)
MCHC: 33.8 g/dL (ref 30.0–36.0)
MCV: 95.2 fL (ref 78.0–100.0)
MCV: 97.8 fL (ref 78.0–100.0)
Platelets: 207 10*3/uL (ref 150–400)
Platelets: 208 10*3/uL (ref 150–400)
RBC: 4.14 MIL/uL — ABNORMAL LOW (ref 4.22–5.81)
RBC: 4.35 MIL/uL (ref 4.22–5.81)
RDW: 13.4 % (ref 11.5–15.5)
RDW: 13.7 % (ref 11.5–15.5)
WBC: 11.1 10*3/uL — ABNORMAL HIGH (ref 4.0–10.5)
WBC: 12.1 10*3/uL — ABNORMAL HIGH (ref 4.0–10.5)

## 2016-06-11 LAB — CREATININE, SERUM
Creatinine, Ser: 0.98 mg/dL (ref 0.61–1.24)
GFR calc Af Amer: >60 mL/min (ref >60)
GFR calc non Af Amer: >60 mL/min (ref >60)

## 2016-06-11 MED ORDER — HYDROCODONE-ACETAMINOPHEN 5-325 MG PO TABS: 1.0000 | ORAL_TABLET | ORAL | 0 refills | Status: DC | PRN

## 2016-06-11 NOTE — Care Management Note (Signed)
Case Management Note  Patient Details  Name: Ryan Manning MRN: [id number] Date of Birth: 10/13/1970  Subjective/Objective:                  Acute appendicitis Action/Plan: Discharge planning Expected Discharge Date:  06/11/16               Expected Discharge Plan:  Home/Self Care  In-House Referral:     Discharge planning Services  CM Consult  Post Acute Care Choice:    Choice offered to:  Patient  DME Arranged:  N/A DME Agency:  NA  HH Arranged:  NA HH Agency:  NA  Status of Service:  Completed, signed off  If discussed at [place] of Stay Meetings, dates discussed:    Additional Comments: CM met with pt in room and gave pt handout for [employer] to secure insurance with a Navigator appointment.  CM also called Financial counseling and spoke with Ivin Booty who states FC will follow with pt. No other CM needs were communicated. Dellie Catholic, RN 06/11/2016, 11:06 AM

## 2016-06-11 NOTE — Progress Notes (Signed)
Assessment Principal Problem:   Appendicitis s/p laparoscopic appendectomy 06/10/16-doing well POD #1.   Plan:  Discharge today.  Instructions discussed with him.   LOS: 0 days     1 Day Post-Op  Subjective: Feels good.  Having minimal pain.  Tolerating diet.  Objective: Vital signs in last 24 hours: Temp:  [97.5 F (36.4 C)-98.6 F (37 C)] 98.6 F (37 C) (02/15 0430) Pulse Rate:  [71-98] 71 (02/15 0430) Resp:  [14-19] 16 (02/15 0430) BP: (104-133)/(79-95) 113/79 (02/15 0430) SpO2:  [90 %-100 %] 99 % (02/15 0430) Weight:  [73 kg (161 lb)] 73 kg (161 lb) (02/14 1512) Last BM Date: 06/10/16  Intake/Output from previous day: 02/14 0701 - 02/15 0700 In: 2388.3 [I.V.:1338.3; IV Piggyback:1050] Out: 25 [Blood:25] Intake/Output this shift: No intake/output data recorded.  PE: General- In NAD Abdomen-soft, incisions are clean and intact  Lab Results:   Recent Labs  06/10/16 2353 06/11/16 0540  WBC 12.1* 11.1*  HGB 14.0 13.4  HCT 41.4 40.5  PLT 207 208   BMET  Recent Labs  06/10/16 1530 06/10/16 2353  NA 138  --   K 4.5  --   CL 103  --   CO2 31  --   GLUCOSE 87  --   BUN 16  --   CREATININE 0.92 0.98  CALCIUM 9.1  --    PT/INR No results for input(s): LABPROT, INR in the last 72 hours. Comprehensive Metabolic Panel:    Component Value Date/Time   NA 138 06/10/2016 1530   K 4.5 06/10/2016 1530   CL 103 06/10/2016 1530   CO2 31 06/10/2016 1530   BUN 16 06/10/2016 1530   CREATININE 0.98 06/10/2016 2353   CREATININE 0.92 06/10/2016 1530   GLUCOSE 87 06/10/2016 1530   CALCIUM 9.1 06/10/2016 1530   AST 21 06/10/2016 1530   ALT 21 06/10/2016 1530   ALKPHOS 60 06/10/2016 1530   BILITOT 0.5 06/10/2016 1530   PROT 7.6 06/10/2016 1530   ALBUMIN 4.2 06/10/2016 1530     Studies/Results: Ct Abdomen Pelvis W Contrast  Result Date: 06/10/2016 CLINICAL DATA:  46 year old male with right lower quadrant abdominal pain since yesterday. Initial encounter.  EXAM: CT ABDOMEN AND PELVIS WITH CONTRAST TECHNIQUE: Multidetector CT imaging of the abdomen and pelvis was performed using the standard protocol following bolus administration of intravenous contrast. CONTRAST:  161mL ISOVUE-300 IOPAMIDOL (ISOVUE-300) INJECTION 61% COMPARISON:  None. FINDINGS: Lower chest: Normal lung bases.  No pericardial or pleural effusion. Hepatobiliary: Benign-appearing 10 mm low-density area in the left hepatic lobe (series 2, image 17), probably a small cyst. Similar but smaller hypodensity in the caudate lobe. Otherwise normal liver and gallbladder. Pancreas: Negative. Spleen: Negative. Adrenals/Urinary Tract: Normal adrenal glands. Bilateral renal enhancement an contrast excretion is normal. Diminutive urinary bladder.  Occasional pelvic phleboliths. Stomach/Bowel: Decompressed distal large bowel. Negative splenic flexure, transverse colon and hepatic flexure. The ascending colon is within normal limits. There is an elongated appendicolith within the base of the appendix (coronal images 35 through 45) with acute appendicitis. The appendix is dilated (16 mm), fluid-filled, and hyperenhancing with mild adjacent soft tissue stranding. There might be mild secondary inflammation of the terminal ileum which is also mildly hyperenhancing tracking back into the distal small bowel. Oral contrast was administered and has reached the mid jejunum. No dilated small bowel loops. Negative stomach and duodenum. No abdominal free fluid.  No extraluminal gas. Vascular/Lymphatic: Major arterial structures throughout the abdomen and pelvis are patent. Portal venous  system is patent. No lymphadenopathy. Reproductive: Negative; mild ectasia of the seminal vesicles and dystrophic calcifications of the prostate. Other: No pelvic free fluid. Musculoskeletal: Transitional lumbosacral anatomy. No acute osseous abnormality identified. IMPRESSION: 1. Acute Appendicitis with an elongated appendicolith noted at the  base of the appendix. No abscess or evidence of rupture. 2. Secondary inflammation of the distal small bowel suspected. Electronically Signed   By: Genevie Ann M.D.   On: 06/10/2016 17:13    Anti-infectives: Anti-infectives    Start     Dose/Rate Route Frequency Ordered Stop   06/11/16 0600  piperacillin-tazobactam (ZOSYN) IVPB 3.375 g     3.375 g 12.5 mL/hr over 240 Minutes Intravenous Every 8 hours 06/10/16 2323     06/10/16 2145  piperacillin-tazobactam (ZOSYN) IVPB 3.375 g     3.375 g 100 mL/hr over 30 Minutes Intravenous  Once 06/10/16 2141 06/10/16 2129   06/10/16 1800  piperacillin-tazobactam (ZOSYN) IVPB 4.5 g  Status:  Discontinued     4.5 g 200 mL/hr over 30 Minutes Intravenous  Once 06/10/16 1746 06/10/16 1756   06/10/16 1800  piperacillin-tazobactam (ZOSYN) IVPB 3.375 g     3.375 g 100 mL/hr over 30 Minutes Intravenous  Once 06/10/16 1756 06/10/16 1831       Jayden Rudge J 06/11/2016

## 2016-06-11 NOTE — Anesthesia Postprocedure Evaluation (Signed)
Anesthesia Post Note  Patient: Braiden Klingsporn  Procedure(s) Performed: Procedure(s) (LRB): APPENDECTOMY LAPAROSCOPIC (N/A)  Patient location during evaluation: PACU Anesthesia Type: General Level of consciousness: awake and alert Pain management: pain level controlled Vital Signs Assessment: post-procedure vital signs reviewed and stable Respiratory status: spontaneous breathing, nonlabored ventilation, respiratory function stable and patient connected to nasal cannula oxygen Cardiovascular status: blood pressure returned to baseline and stable Postop Assessment: no signs of nausea or vomiting Anesthetic complications: no        Last Vitals:  Vitals:   06/10/16 2333 06/11/16 0430  BP: (!) 104/91 113/79  Pulse: 75 71  Resp:  16  Temp: 36.7 C 37 C    Last Pain:  Vitals:   06/11/16 0430  TempSrc: Oral  PainSc:    Pain Goal:                 Tiajuana Amass

## 2016-06-11 NOTE — Discharge Summary (Signed)
Physician Discharge Summary  Patient ID: Ryan Manning MRN: [id number] DOB/AGE: October 02, 1970 46 y.o.  Admit date: 06/10/2016 Discharge date: 06/11/2016  Admission Diagnoses:  Acute appendicitis  Discharge Diagnoses:  Principal Problem:   Appendicitis s/p laparoscopic appendectomy 06/10/16   Discharged Condition: good  Hospital Course: He was given IV abxs and underwent the above procedure which he tolerated well.  He was able to be discharged on POD #1.  Discharge instructions were given to him.  Discharge Exam: Blood pressure 113/79, pulse 71, temperature 98.6 F (37 C), temperature source Oral, resp. rate 16, height 5\' 10"  (1.778 m), weight 73 kg (161 lb), SpO2 99 %.   Disposition: Final discharge disposition not confirmed   Allergies as of 06/11/2016   No Known Allergies     Medication List    TAKE these medications   cetirizine 10 MG tablet Commonly known as:  ZYRTEC Take 10 mg by mouth daily as needed for allergies.   HYDROcodone-acetaminophen 5-325 MG tablet Commonly known as:  NORCO/VICODIN Take 1-2 tablets by mouth every 4 (four) hours as needed for moderate pain.        Signed: Odis Hollingshead 06/11/2016, 11:04 AM

## 2019-04-28 DIAGNOSIS — C61 Malignant neoplasm of prostate: Secondary | ICD-10-CM

## 2019-04-28 HISTORY — DX: Malignant neoplasm of prostate: C61

## 2019-08-07 DIAGNOSIS — Z0000 Encounter for general adult medical examination without abnormal findings: Secondary | ICD-10-CM | POA: Diagnosis not present

## 2019-08-07 DIAGNOSIS — R202 Paresthesia of skin: Secondary | ICD-10-CM | POA: Diagnosis not present

## 2019-08-07 DIAGNOSIS — Z13220 Encounter for screening for lipoid disorders: Secondary | ICD-10-CM | POA: Diagnosis not present

## 2019-08-07 DIAGNOSIS — Z125 Encounter for screening for malignant neoplasm of prostate: Secondary | ICD-10-CM | POA: Diagnosis not present

## 2019-08-14 ENCOUNTER — Other Ambulatory Visit: Payer: Self-pay | Admitting: Family Medicine

## 2019-08-14 DIAGNOSIS — E7800 Pure hypercholesterolemia, unspecified: Secondary | ICD-10-CM

## 2019-08-15 DIAGNOSIS — R6883 Chills (without fever): Secondary | ICD-10-CM | POA: Diagnosis not present

## 2019-08-31 ENCOUNTER — Ambulatory Visit
Admission: RE | Admit: 2019-08-31 | Discharge: 2019-08-31 | Disposition: A | Discharge status: Home / Self Care | Payer: No Typology Code available for payment source | Source: Ambulatory Visit | Attending: Family Medicine | Admitting: Family Medicine

## 2019-08-31 DIAGNOSIS — E7800 Pure hypercholesterolemia, unspecified: Secondary | ICD-10-CM

## 2019-09-11 ENCOUNTER — Other Ambulatory Visit: Payer: Self-pay

## 2019-09-19 DIAGNOSIS — Z125 Encounter for screening for malignant neoplasm of prostate: Secondary | ICD-10-CM | POA: Diagnosis not present

## 2019-10-08 ENCOUNTER — Other Ambulatory Visit: Payer: Self-pay

## 2019-10-08 ENCOUNTER — Encounter (HOSPITAL_COMMUNITY): Payer: Self-pay | Admitting: *Deleted

## 2019-10-08 ENCOUNTER — Emergency Department (HOSPITAL_COMMUNITY)
Admission: EM | Admit: 2019-10-08 | Discharge: 2019-10-08 | Disposition: A | Discharge status: Home / Self Care | Payer: BC Managed Care – PPO | Attending: Emergency Medicine | Admitting: Emergency Medicine

## 2019-10-08 DIAGNOSIS — Y939 Activity, unspecified: Secondary | ICD-10-CM | POA: Diagnosis not present

## 2019-10-08 DIAGNOSIS — F17210 Nicotine dependence, cigarettes, uncomplicated: Secondary | ICD-10-CM | POA: Insufficient documentation

## 2019-10-08 DIAGNOSIS — S0101XA Laceration without foreign body of scalp, initial encounter: Secondary | ICD-10-CM | POA: Diagnosis not present

## 2019-10-08 DIAGNOSIS — Y999 Unspecified external cause status: Secondary | ICD-10-CM | POA: Diagnosis not present

## 2019-10-08 DIAGNOSIS — Y929 Unspecified place or not applicable: Secondary | ICD-10-CM | POA: Insufficient documentation

## 2019-10-08 DIAGNOSIS — W07XXXA Fall from chair, initial encounter: Secondary | ICD-10-CM | POA: Insufficient documentation

## 2019-10-08 NOTE — ED Provider Notes (Signed)
[facility] DEPT Provider Note: Georgena Spurling, MD, FACEP  CSN: [id number] MRN: [id number] ARRIVAL: 10/08/19 at [place]: [place]  Laceration   HISTORY OF PRESENT ILLNESS  10/08/19 4:22 AM Savon Bordonaro is a 49 y.o. male who leaned back in a chair about 8 PM yesterday evening and fell backwards.  He struck his head on the edge of a desk.  He has a laceration to his occiput.  There was no loss of consciousness.  He has had no vomiting.  He is not on anticoagulation.  He rates associated pain as minimal.  Bleeding is controlled.  He showered and washed the wound with Hibiclens prior to arrival.  He is at his baseline mentally.  History reviewed. No pertinent past medical history.  Past Surgical History:  Procedure Laterality Date  . HEMORRHOID SURGERY    . LAPAROSCOPIC APPENDECTOMY N/A 06/10/2016   Procedure: APPENDECTOMY LAPAROSCOPIC;  Surgeon: Johnathan Hausen, MD;  Location: [facility];  Service: General;  Laterality: N/A;    No family history on file.  Social History   Tobacco Use  . Smoking status: Current Every Day Smoker  . Smokeless tobacco: Never Used  Substance Use Topics  . Alcohol use: Yes    Comment: occ  . Drug use: Yes    Types: Marijuana    Prior to Admission medications   Medication Sig Start Date End Date Taking? Authorizing Provider  cetirizine (ZYRTEC) 10 MG tablet Take 10 mg by mouth daily as needed for allergies.    [provider]    Allergies Patient has no known allergies.   REVIEW OF SYSTEMS  Negative except as noted here or in the History of Present Illness.   PHYSICAL EXAMINATION  Initial Vital Signs Blood pressure (!) 147/93, pulse 62, temperature 98.3 F (36.8 C), temperature source Oral, resp. rate 16, SpO2 100 %.  Examination General: Well-developed, well-nourished male in no acute distress; appearance consistent with age of record HENT: normocephalic; laceration to right occiput without underlying  hematoma Eyes: pupils equal, round and reactive to light; extraocular muscles intact Neck: supple; nontender Heart: regular rate and rhythm Lungs: clear to auscultation bilaterally Abdomen: soft; nondistended; nontender; bowel sounds present Extremities: No deformity; full range of motion Neurologic: Awake, alert and oriented; motor function intact in all extremities and symmetric; no facial droop Skin: Warm and dry Psychiatric: Normal mood and affect   RESULTS  Summary of this visit's results, reviewed and interpreted by myself:   EKG Interpretation  Date/Time:    Ventricular Rate:    PR Interval:    QRS Duration:   QT Interval:    QTC Calculation:   R Axis:     Text Interpretation:        Laboratory Studies: No results found for this or any previous visit (from the past 24 hour(s)). Imaging Studies: No results found.  ED COURSE and MDM  Nursing notes, initial and subsequent vitals signs, including pulse oximetry, reviewed and interpreted by myself.  Vitals:   10/08/19 0317  BP: (!) 147/93  Pulse: 62  Resp: 16  Temp: 98.3 F (36.8 C)  TempSrc: Oral  SpO2: 100%   Medications - No data to display    PROCEDURES  Procedures  LACERATION REPAIR Performed by: Karen Chafe Capone Schwinn Authorized by: Karen Chafe Aldahir Litaker Consent: Verbal consent obtained. Risks and benefits: risks, benefits and alternatives were discussed Consent given by: patient Patient identity confirmed: provided demographic data Prepped and Draped in normal sterile fashion Wound  explored  Laceration Location: Right occiput  Laceration Length: 3.5 cm  No Foreign Bodies seen or palpated  Anesthesia: Patient declined  Irrigation method: syringe Amount of cleaning: standard  Skin closure: Staples  Number of staples: 4  Patient tolerance: Patient tolerated the procedure well with no immediate complications.   ED DIAGNOSES     ICD-10-CM   1. Scalp laceration, initial encounter  S01.01XA   2.  Fall from chair, initial encounter  W07.Encarnacion Chu, MD 10/08/19 [id number]

## 2019-10-08 NOTE — ED Triage Notes (Signed)
Pt states around 8pm he was sitting in a chair with wheels and the chair flipped back, he struck his head on the edge of a desk. He has a lac to the back of his head. No LOC, no blood thinners.

## 2019-10-24 DIAGNOSIS — R972 Elevated prostate specific antigen [PSA]: Secondary | ICD-10-CM | POA: Diagnosis not present

## 2020-01-18 ENCOUNTER — Other Ambulatory Visit (HOSPITAL_COMMUNITY): Payer: Self-pay | Admitting: Urology

## 2020-01-18 ENCOUNTER — Other Ambulatory Visit: Payer: Self-pay | Admitting: Urology

## 2020-01-18 DIAGNOSIS — C61 Malignant neoplasm of prostate: Secondary | ICD-10-CM

## 2020-01-24 ENCOUNTER — Encounter (HOSPITAL_COMMUNITY)
Admission: RE | Admit: 2020-01-24 | Discharge: 2020-01-24 | Disposition: A | Discharge status: Home / Self Care | Payer: 59 | Source: Ambulatory Visit | Attending: Urology | Admitting: Urology

## 2020-01-24 ENCOUNTER — Other Ambulatory Visit: Payer: Self-pay

## 2020-01-24 DIAGNOSIS — C61 Malignant neoplasm of prostate: Secondary | ICD-10-CM | POA: Insufficient documentation

## 2020-01-24 MED ORDER — TECHNETIUM TC 99M MEDRONATE IV KIT
21.3000 | PACK | Freq: Once | INTRAVENOUS | Status: AC | PRN
  Administered 2020-01-24: 21.3 via INTRAVENOUS

## 2020-01-25 ENCOUNTER — Encounter: Payer: Self-pay | Admitting: Radiation Oncology

## 2020-01-25 NOTE — Progress Notes (Signed)
GU Location of Tumor / Histology: prostatic adenocarcinoma  If Prostate Cancer, Gleason Score is (3 + 4) and PSA is (21.90). Prostate volume: 22.23  Ryan Manning was referred to Dr. Lovena Neighbours by Dr. Kathyrn Lass after his PSA remain elevated following a course of Cipro.   08/07/19 psa 19.19 09/19/19 psa 19.56  Patient father with a history of prostate cancer.  Biopsies of prostate (if applicable) revealed:    Past/Anticipated interventions by urology, if any: prostate biopsy, CT abd/pelvis, bone scan (results?), referral to discuss radiotherapeutic options.   Past/Anticipated interventions by medical oncology, if any: no  Weight changes, if any: no  Bowel/Bladder complaints, if any: IPSS 3. SHIM 25. Denies dysuria, hematuria, or urinary leakage.Reports good force of stream and feels like he is emptying his bladder well. He has occasional urgency and frequency but isn't bother by this. Reports nocturia x 1. Denies dysuria or hematuria.   Nausea/Vomiting, if any: no  Pain issues, if any:  no  SAFETY ISSUES:  Prior radiation? no  Pacemaker/ICD? no  Possible current pregnancy? No, male patient  Is the patient on methotrexate? no  Current Complaints / other details:  49 year old male. Married. Resides in [place]. Smokes 1/2 ppd.

## 2020-01-26 ENCOUNTER — Encounter: Payer: Self-pay | Admitting: Radiation Oncology

## 2020-01-26 ENCOUNTER — Ambulatory Visit
Admission: RE | Admit: 2020-01-26 | Discharge: 2020-01-26 | Disposition: A | Discharge status: Home / Self Care | Payer: MEDICAID | Source: Ambulatory Visit | Attending: Radiation Oncology | Admitting: Radiation Oncology

## 2020-01-26 ENCOUNTER — Other Ambulatory Visit: Payer: Self-pay

## 2020-01-26 VITALS — BP 118/78 | HR 78 | Temp 95.5°F | Resp 18 | Ht 70.0 in | Wt 154.2 lb

## 2020-01-26 DIAGNOSIS — C61 Malignant neoplasm of prostate: Secondary | ICD-10-CM | POA: Diagnosis present

## 2020-01-26 DIAGNOSIS — F17210 Nicotine dependence, cigarettes, uncomplicated: Secondary | ICD-10-CM | POA: Insufficient documentation

## 2020-01-26 DIAGNOSIS — Z803 Family history of malignant neoplasm of breast: Secondary | ICD-10-CM | POA: Diagnosis not present

## 2020-01-26 DIAGNOSIS — Z800 Family history of malignant neoplasm of digestive organs: Secondary | ICD-10-CM | POA: Diagnosis not present

## 2020-01-26 HISTORY — DX: Malignant neoplasm of prostate: C61

## 2020-01-26 NOTE — Progress Notes (Signed)
Radiation Oncology         [phone] ________________________________  Initial Outpatient Consultation  Name: Ryan Manning MRN: [id number]  Date: 01/26/2020  DOB: 12/21/1970  QI:[id number], Lattie Haw, MD  Davis Gourd*   REFERRING PHYSICIAN: Davis Gourd*  DIAGNOSIS: 49 y.o. gentleman with Stage T1c adenocarcinoma of the prostate with Gleason score of 3+4, and PSA of 21.9.    ICD-10-CM   1. Malignant neoplasm of prostate ([place])  C61     HISTORY OF PRESENT ILLNESS: Ryan Manning is a 49 y.o. male with a diagnosis of prostate cancer. He was noted to have a persistently elevated PSA of 19 despite a course of Cipro, prescribed by his primary care physician, Dr. Sabra Heck.  Accordingly, he was referred for evaluation in urology with Dr. Lovena Neighbours on 10/24/2019,  digital rectal examination was performed at that time revealing no nodules.  A repeat PSA that same day showed further elevation to 21.9.  Therefore, the patient proceeded to transrectal ultrasound with 12 biopsies of the prostate on 12/21/2019.  The prostate volume measured 22.23 cc.  Out of 12 core biopsies, 1 was positive.  The maximum Gleason score was 3+4, and this was seen in the left apex lateral.  Of note, focal atypial glands suspicious for perineural carcinoma were seen in the left base lateral.  He underwent CT A/P on 01/25/2020 showing no acute process or evidence of metastatic disease. A bone scan was also performed on 01/24/2020, and this was negative for osseous metastatic disease.  The patient reviewed the biopsy results with his urologist and he has kindly been referred today for discussion of potential radiation treatment options.  He is also scheduled to meet with Dr. Alinda Money on 01/30/2020 to discuss surgical options.   PREVIOUS RADIATION THERAPY: No  PAST MEDICAL HISTORY:  Past Medical History:  Diagnosis Date  . Prostate cancer ([place])       PAST SURGICAL HISTORY: Past Surgical History:  Procedure  Laterality Date  . HEMORRHOID SURGERY    . LAPAROSCOPIC APPENDECTOMY N/A 06/10/2016   Procedure: APPENDECTOMY LAPAROSCOPIC;  Surgeon: Johnathan Hausen, MD;  Location: [facility];  Service: General;  Laterality: N/A;  . PROSTATE BIOPSY      FAMILY HISTORY:  Family History  Problem Relation Age of Onset  . Prostate cancer Father   . Prostate cancer Brother   . Cancer Cousin        paternal  . Breast cancer Neg Hx   . Colon cancer Neg Hx   . Pancreatic cancer Neg Hx     SOCIAL HISTORY:  Social History   Socioeconomic History  . Marital status: Married    Spouse name: Not on file  . Number of children: 0  . Years of education: Not on file  . Highest education level: Not on file  Occupational History  . Not on file  Tobacco Use  . Smoking status: Current Every Day Smoker    Packs/day: 0.50    Years: 37.00    Pack years: 18.50    Types: Cigarettes  . Smokeless tobacco: Never Used  Vaping Use  . Vaping Use: Never used  Substance and Sexual Activity  . Alcohol use: Yes    Comment: occ  . Drug use: Yes    Types: Marijuana  . Sexual activity: Yes  Other Topics Concern  . Not on file  Social History Narrative  . Not on file   Social Determinants of Health   Financial Resource Strain:   . Difficulty  of Paying Living Expenses: Not on file  Food Insecurity:   . Worried About Charity fundraiser in the Last Year: Not on file  . Ran Out of Food in the Last Year: Not on file  Transportation Needs:   . Lack of Transportation (Medical): Not on file  . Lack of Transportation (Non-Medical): Not on file  Physical Activity:   . Days of Exercise per Week: Not on file  . Minutes of Exercise per Session: Not on file  Stress:   . Feeling of Stress : Not on file  Social Connections:   . Frequency of Communication with Friends and Family: Not on file  . Frequency of Social Gatherings with Friends and Family: Not on file  . Attends Religious Services: Not on file  . Active Member of  Clubs or Organizations: Not on file  . Attends Archivist Meetings: Not on file  . Marital Status: Not on file  Intimate Partner Violence:   . Fear of Current or Ex-Partner: Not on file  . Emotionally Abused: Not on file  . Physically Abused: Not on file  . Sexually Abused: Not on file    ALLERGIES: Patient has no known allergies.  MEDICATIONS:  Current Outpatient Medications  Medication Sig Dispense Refill  . cetirizine (ZYRTEC) 10 MG tablet Take 10 mg by mouth daily as needed for allergies. (Patient not taking: Reported on 01/26/2020)     No current facility-administered medications for this encounter.    REVIEW OF SYSTEMS:  On review of systems, the patient reports that he is doing well overall. He denies any chest pain, shortness of breath, cough, fevers, chills, night sweats, unintended weight changes. He denies any bowel disturbances, and denies abdominal pain, nausea or vomiting. He denies any new musculoskeletal or joint aches or pains. His IPSS was 3, indicating mild urinary symptoms. He reports occasional urinary urgency and frequency, but notes he is not bothered by this, and nocturia x1. His SHIM was 25, indicating he does not have erectile dysfunction. A complete review of systems is obtained and is otherwise negative.    PHYSICAL EXAM:  Wt Readings from Last 3 Encounters:  01/26/20 154 lb 4 oz (70 kg)  06/10/16 161 lb (73 kg)   Temp Readings from Last 3 Encounters:  01/26/20 (!) 95.5 F (35.3 C) (Tympanic)  10/08/19 98.3 F (36.8 C) (Oral)  06/11/16 98.6 F (37 C) (Oral)   BP Readings from Last 3 Encounters:  01/26/20 118/78  10/08/19 (!) 147/93  06/11/16 113/79   Pulse Readings from Last 3 Encounters:  01/26/20 78  10/08/19 62  06/11/16 71   Pain Assessment Pain Score: 0-No pain/10  In general this is a well appearing gentleman in no acute distress. He's alert and oriented x4 and appropriate throughout the examination. Cardiopulmonary  assessment is negative for acute distress and he exhibits normal effort.    KPS = 100  100 - Normal; no complaints; no evidence of disease. 90   - Able to carry on normal activity; minor signs or symptoms of disease. 80   - Normal activity with effort; some signs or symptoms of disease. 35   - Cares for self; unable to carry on normal activity or to do active work. 60   - Requires occasional assistance, but is able to care for most of his personal needs. 50   - Requires considerable assistance and frequent medical care. 37   - Disabled; requires special care and assistance. 30   -  Severely disabled; hospital admission is indicated although death not imminent. 86   - Very sick; hospital admission necessary; active supportive treatment necessary. 10   - Moribund; fatal processes progressing rapidly. 0     - Dead  Karnofsky DA, Abelmann Bison, Craver LS and Burchenal [facility] [phone]) The use of the nitrogen mustards in the palliative treatment of carcinoma: with particular reference to bronchogenic carcinoma Cancer 1 634-56  LABORATORY DATA:  Lab Results  Component Value Date   WBC 11.1 (H) 06/11/2016   HGB 13.4 06/11/2016   HCT 40.5 06/11/2016   MCV 97.8 06/11/2016   PLT 208 06/11/2016   Lab Results  Component Value Date   NA 138 06/10/2016   K 4.5 06/10/2016   CL 103 06/10/2016   CO2 31 06/10/2016   Lab Results  Component Value Date   ALT 21 06/10/2016   AST 21 06/10/2016   ALKPHOS 60 06/10/2016   BILITOT 0.5 06/10/2016     RADIOGRAPHY: NM Bone Scan Whole Body  Result Date: 01/26/2020 CLINICAL DATA:  Prostate carcinoma EXAM: NUCLEAR MEDICINE WHOLE BODY BONE SCAN TECHNIQUE: Whole body anterior and posterior images were obtained approximately 3 hours after intravenous injection of radiopharmaceutical. RADIOPHARMACEUTICALS:  03.5 millicurie mCi [id number] MDP IV COMPARISON:  None. FINDINGS: There is no evidence of bony metastatic disease. There is probable arthropathic change in  the shoulders. Kidneys noted in the flank positions bilaterally. IMPRESSION: No evident bony metastatic disease. Electronically Signed   By: Lowella Grip III M.D.   On: 01/26/2020 11:13      IMPRESSION/PLAN:  1. 49 y.o. gentleman with Stage T1c adenocarcinoma of the prostate with Gleason Score of 3+4, and PSA of 21.9. We discussed the patient's workup and outlined the nature of prostate cancer in this setting. The patient's T stage, Gleason's score, and PSA put him into the high risk group. Accordingly, he is eligible for a variety of potential treatment options including prostatectomy or LT-ADT in combination with either 8 weeks of external radiation or brachytherapy seed boost followed by 5 weeks of EBRT. We discussed the available radiation techniques, and focused on the details and logistics of delivery. We discussed and outlined the risks, benefits, short and long-term effects associated with radiotherapy and compared and contrasted these with prostatectomy. We discussed the role of SpaceOAR in reducing the rectal toxicity associated with radiotherapy. We also detailed the role of ADT in the treatment of high risk prostate cancer and outlined the associated side effects that could be expected with this therapy.  He appears to have a good understanding of his disease and our treatment recommendations which are of curative intent.  He was encouraged to ask questions that were answered to his stated satisfaction.  At the end of the conversation the patient is interested in further discussion regarding surgical options prior to making a final treatment decision. He is scheduled for consultation with Dr. Alinda Money on 01/30/20 and plans to reach a final decision shortly thereafter. He has our contact information and we will plan to follow up with him in the next 1-2 weeks, if we have not heard from him in the interim. We will share our discussion with Dr. Lovena Neighbours and proceed with treatment planning accordingly  at that time, should he elect to proceed with radiation. We enjoyed meeting him today and look forward to continuing to participate in his care.    Nicholos Johns, PA-C    Tyler Pita, MD  [facility] Direct Dial: [phone]  Fax: [phone] [email or web address]  Skype  [email or web address]  This document serves as a record of services personally performed by Tyler Pita, MD and Freeman Caldron, PA-C. It was created on their behalf by Wilburn Mylar, a trained medical scribe. The creation of this record is based on the scribe's personal observations and the provider's statements to them. This document has been checked and approved by the attending provider.

## 2020-01-31 DIAGNOSIS — C61 Malignant neoplasm of prostate: Secondary | ICD-10-CM | POA: Insufficient documentation

## 2020-02-07 ENCOUNTER — Encounter: Payer: Self-pay | Admitting: Urology

## 2020-02-07 NOTE — Progress Notes (Addendum)
Per communication from Dr. Lovena Neighbours, this patient has elected to proceed with RALP with Dr. Alinda Money.  Nicholos Johns, MMS, PA-C [facility] [place]: [phone]  Fax: [phone]

## 2020-02-08 ENCOUNTER — Encounter: Payer: Self-pay | Admitting: Medical Oncology

## 2020-02-21 ENCOUNTER — Other Ambulatory Visit: Payer: Self-pay | Admitting: Urology

## 2020-03-05 NOTE — Patient Instructions (Addendum)
DUE TO COVID-19 ONLY ONE VISITOR IS ALLOWED TO COME WITH YOU AND STAY IN THE WAITING ROOM ONLY DURING PRE OP AND PROCEDURE DAY OF SURGERY. THE 1 VISITOR  MAY VISIT WITH YOU AFTER SURGERY IN YOUR PRIVATE ROOM DURING VISITING HOURS ONLY!  YOU NEED TO HAVE A COVID 19 TEST ON: 03/11/20 @ 12:00 PM , THIS TEST MUST BE DONE BEFORE SURGERY,  COVID TESTING SITE [employer] [place]  [id number], IT IS ON THE RIGHT GOING OUT [address] APPROXIMATELY  2 MINUTES PAST [employer] ON THE RIGHT. ONCE YOUR COVID TEST IS COMPLETED,  PLEASE BEGIN THE QUARANTINE INSTRUCTIONS AS OUTLINED IN YOUR HANDOUT.                Ryan Manning    Your procedure is scheduled on: 03/14/20   Report to [facility] Main  Entrance   Report to admitting at: 9:15 AM     Call this number if you have problems the morning of surgery [phone]    Remember: Do not eat solid food :After Midnight. Clear liquids from midnight until: 8:15 am.  CLEAR LIQUID DIET   Foods Allowed                                                                     Foods Excluded  Coffee and tea, regular and decaf                             liquids that you cannot  Plain Jell-O any favor except red or purple                                           see through such as: Fruit ices (not with fruit pulp)                                     milk, soups, orange juice  Iced Popsicles                                    All solid food Carbonated beverages, regular and diet                                    Cranberry, grape and apple juices Sports drinks like Gatorade Lightly seasoned clear broth or consume(fat free) Sugar, honey syrup  Sample Menu Breakfast                                Lunch                                     Supper Cranberry juice  Beef broth                            Chicken broth Jell-O                                     Grape juice                           Apple  juice Coffee or tea                        Jell-O                                      Popsicle                                                Coffee or tea                        Coffee or tea  _____________________________________________________________________  BRUSH YOUR TEETH MORNING OF SURGERY AND RINSE YOUR MOUTH OUT, NO CHEWING GUM CANDY OR MINTS.     Take these medicines the morning of surgery with A SIP OF WATER: cetirizine.Use Flonase as usual.                               You may not have any metal on your body including hair pins and              piercings  Do not wear jewelry, lotions, powders or perfumes, deodorant             Men may shave face and neck.   Do not bring valuables to the hospital. [place].  Contacts, dentures or bridgework may not be worn into surgery.  Leave suitcase in the car. After surgery it may be brought to your room.     Patients discharged the day of surgery will not be allowed to drive home. IF YOU ARE HAVING SURGERY AND GOING HOME THE SAME DAY, YOU MUST HAVE AN ADULT TO DRIVE YOU HOME AND BE WITH YOU FOR 24 HOURS. YOU MAY GO HOME BY TAXI OR UBER OR ORTHERWISE, BUT AN ADULT MUST ACCOMPANY YOU HOME AND STAY WITH YOU FOR 24 HOURS.  Name and phone number of your driver:  Special Instructions: N/A              Please read over the following fact sheets you were given: _____________________________________________________________________         [facility] - Preparing for Surgery Before surgery, you can play an important role.  Because skin is not sterile, your skin needs to be as free of germs as possible.  You can reduce the number of germs on your skin by washing with CHG (chlorahexidine gluconate) soap before surgery.  CHG is an antiseptic cleaner which kills germs and bonds with the skin to continue  killing germs even after washing. Please DO NOT use if you have an allergy to CHG or  antibacterial soaps.  If your skin becomes reddened/irritated stop using the CHG and inform your nurse when you arrive at Short Stay. Do not shave (including legs and underarms) for at least 48 hours prior to the first CHG shower.  You may shave your face/neck. Please follow these instructions carefully:  1.  Shower with CHG Soap the night before surgery and the  morning of Surgery.  2.  If you choose to wash your hair, wash your hair first as usual with your  normal  shampoo.  3.  After you shampoo, rinse your hair and body thoroughly to remove the  shampoo.                           4.  Use CHG as you would any other liquid soap.  You can apply chg directly  to the skin and wash                       Gently with a scrungie or clean washcloth.  5.  Apply the CHG Soap to your body ONLY FROM THE NECK DOWN.   Do not use on face/ open                           Wound or open sores. Avoid contact with eyes, ears mouth and genitals (private parts).                       Wash face,  Genitals (private parts) with your normal soap.             6.  Wash thoroughly, paying special attention to the area where your surgery  will be performed.  7.  Thoroughly rinse your body with warm water from the neck down.  8.  DO NOT shower/wash with your normal soap after using and rinsing off  the CHG Soap.                9.  Pat yourself dry with a clean towel.            10.  Wear clean pajamas.            11.  Place clean sheets on your bed the night of your first shower and do not  sleep with pets. Day of Surgery : Do not apply any lotions/deodorants the morning of surgery.  Please wear clean clothes to the hospital/surgery center.  FAILURE TO FOLLOW THESE INSTRUCTIONS MAY RESULT IN THE CANCELLATION OF YOUR SURGERY PATIENT SIGNATURE_________________________________  NURSE SIGNATURE__________________________________  ________________________________________________________________________

## 2020-03-06 ENCOUNTER — Encounter (HOSPITAL_COMMUNITY): Payer: Self-pay

## 2020-03-06 ENCOUNTER — Encounter (HOSPITAL_COMMUNITY)
Admission: RE | Admit: 2020-03-06 | Discharge: 2020-03-06 | Disposition: A | Discharge status: Home / Self Care | Payer: 59 | Source: Ambulatory Visit | Attending: Urology | Admitting: Urology

## 2020-03-06 ENCOUNTER — Other Ambulatory Visit: Payer: Self-pay

## 2020-03-06 DIAGNOSIS — Z01812 Encounter for preprocedural laboratory examination: Secondary | ICD-10-CM | POA: Diagnosis present

## 2020-03-06 LAB — CBC
HCT: 44.6 % (ref 39.0–52.0)
Hemoglobin: 14.9 g/dL (ref 13.0–17.0)
MCH: 33.5 pg (ref 26.0–34.0)
MCHC: 33.4 g/dL (ref 30.0–36.0)
MCV: 100.2 fL — ABNORMAL HIGH (ref 80.0–100.0)
Platelets: 214 10*3/uL (ref 150–400)
RBC: 4.45 MIL/uL (ref 4.22–5.81)
RDW: 13.1 % (ref 11.5–15.5)
WBC: 7.2 10*3/uL (ref 4.0–10.5)
nRBC: 0 % (ref 0.0–0.2)

## 2020-03-06 LAB — BASIC METABOLIC PANEL
Anion gap: 8 (ref 5–15)
BUN: 16 mg/dL (ref 6–20)
CO2: 29 mmol/L (ref 22–32)
Calcium: 9.1 mg/dL (ref 8.9–10.3)
Chloride: 101 mmol/L (ref 98–111)
Creatinine, Ser: 0.85 mg/dL (ref 0.61–1.24)
GFR, Estimated: >60 mL/min (ref >60)
Glucose, Bld: 97 mg/dL (ref 70–99)
Potassium: 4.4 mmol/L (ref 3.5–5.1)
Sodium: 138 mmol/L (ref 135–145)

## 2020-03-06 NOTE — Progress Notes (Addendum)
COVID Vaccine Completed: Yes Date COVID Vaccine completed: 08/12/19 COVID vaccine manufacturer: Pfizer     PCP - Dr. Kathyrn Lass. Cardiologist -   Chest x-ray -  EKG - 01/17/20. CEW Stress Test -  ECHO -  Cardiac Cath -  Pacemaker/ICD device last checked:  Sleep Study -  CPAP -   Fasting Blood Sugar -  Checks Blood Sugar _____ times a day  Blood Thinner Instructions: Aspirin Instructions: Last Dose:  Anesthesia review:   Patient denies shortness of breath, fever, cough and chest pain at PAT appointment   Patient verbalized understanding of instructions that were given to them at the PAT appointment. Patient was also instructed that they will need to review over the PAT instructions again at home before surgery.

## 2020-03-11 ENCOUNTER — Other Ambulatory Visit (HOSPITAL_COMMUNITY)
Admission: RE | Admit: 2020-03-11 | Discharge: 2020-03-11 | Disposition: A | Discharge status: Home / Self Care | Payer: 59 | Source: Ambulatory Visit | Attending: Urology | Admitting: Urology

## 2020-03-11 DIAGNOSIS — Z20822 Contact with and (suspected) exposure to covid-19: Secondary | ICD-10-CM | POA: Insufficient documentation

## 2020-03-11 DIAGNOSIS — Z01812 Encounter for preprocedural laboratory examination: Secondary | ICD-10-CM | POA: Insufficient documentation

## 2020-03-11 LAB — SARS CORONAVIRUS 2 (TAT 6-24 HRS): SARS Coronavirus 2: NEGATIVE

## 2020-03-13 NOTE — H&P (Addendum)
CC: Prostate Cancer    Ryan Manning is a 49 year old gentleman who was found to have an elevated PSA of 21.9 prompting a TRUS biopsy of the prostate by Dr. Lovena Neighbours on 12/21/19. This confirmed Gleason 3+4=7 adenocarcinoma with 1 out of 12 biopsy cores positive for malignancy.   Family history: None.   Imaging studies:  CT abdomen/pelvis (01/25/20): Negative for metastatic disease.  Bone scan (01/24/20): Negative for metastatic disease.   PMH: He has a history of GERD and anxiety.  PSH: Laparoscopic appendectomy   TNM stage: cT2a N0 M0 (L apex)  PSA: 21.9  Gleason score: 3+4=7 (GG 2)  Biopsy (12/21/19): 1/12 cores positive  Left: L lateral apex (20%, 3+4=7)  Right: Benign  Prostate volume: 22.2 cc   Nomogram  OC disease: 55%  EPE: 39%  SVI: 5%  LNI: 3%  PFS (5 year, 10 year): 73%, 58%   Urinary function: IPSS is 2.  Erectile function: SHIM score is 25.     ALLERGIES: None   MEDICATIONS: Hydrocodone-Acetaminophen 5 mg-325 mg tablet 1 tablet PO As Directed Take one hour prior to your scheduled prostate biopsy  Levaquin 750 mg tablet 1 tablet PO Daily As Directed Start taking the day before, the day of and the day after your prostate biopsy  Vitamin B12     GU PSH: Locm 300-399Mg /Ml Iodine,1Ml - 01/25/2020 Prostate Needle Biopsy - 12/21/2019     NON-GU PSH: Appendectomy Surgical Pathology, Gross And Microscopic Examination For Prostate Needle - 12/21/2019     GU PMH: Elevated PSA - 01/25/2020, - 10/24/2019    NON-GU PMH: Anxiety GERD Hypercholesterolemia    FAMILY HISTORY: Kidney Failure - Father Prostate Cancer - Father   SOCIAL HISTORY: Marital Status: Married Preferred Language: English; Race: White Current Smoking Status: Patient smokes. Smokes 1/2 pack per day.   Tobacco Use Assessment Completed: Used Tobacco in last 30 days? Drinks 12 drinks per week.  Drinks 3 caffeinated drinks per day.    REVIEW OF SYSTEMS:    GU Review Male:   Patient denies trouble  starting your streams, hard to postpone urination, burning/ pain with urination, frequent urination, have to strain to urinate , get up at night to urinate, stream starts and stops, and leakage of urine.  Gastrointestinal (Upper):   Patient denies nausea and vomiting.  Gastrointestinal (Lower):   Patient denies diarrhea and constipation.  Constitutional:   Patient denies fever, night sweats, weight loss, and fatigue.  Skin:   Patient denies skin rash/ lesion and itching.  Eyes:   Patient denies blurred vision and double vision.  Ears/ Nose/ Throat:   Patient denies sore throat and sinus problems.  Hematologic/Lymphatic:   Patient denies swollen glands and easy bruising.  Cardiovascular:   Patient denies leg swelling and chest pains.  Respiratory:   Patient denies cough and shortness of breath.  Endocrine:   Patient denies excessive thirst.  Musculoskeletal:   Patient denies back pain and joint pain.  Neurological:   Patient denies headaches and dizziness.  Psychologic:   Patient denies depression and anxiety.   VITAL SIGNS:     Weight 155 lb / 70.31 kg  Height 71 in / 180.34 cm  BMI 21.6 kg/m     MULTI-SYSTEM PHYSICAL EXAMINATION:    Constitutional: Well-nourished. No physical deformities. Normally developed. Good grooming.  Neck: Neck symmetrical, not swollen. Normal tracheal position.  Respiratory: No labored breathing, no use of accessory muscles. Clear bilaterally.  Cardiovascular: Normal temperature, normal extremity pulses,  no swelling, no varicosities. Regular rate and rhythm.  Lymphatic: No enlargement of neck, axillae, groin.  Skin: No paleness, no jaundice, no cyanosis. No lesion, no ulcer, no rash.  Neurologic / Psychiatric: Oriented to time, oriented to place, oriented to person. No depression, no anxiety, no agitation.  Gastrointestinal: No mass, no tenderness, no rigidity, non obese abdomen.  Eyes: Normal conjunctivae. Normal eyelids.  Ears, Nose, Mouth, and Throat:  Left ear no scars, no lesions, no masses. Right ear no scars, no lesions, no masses. Nose no scars, no lesions, no masses. Normal hearing. Normal lips.  Musculoskeletal: Normal gait and station of head and neck.     Complexity of Data:  Lab Test Review:   PSA  Records Review:   Pathology Reports, Previous Patient Records  X-Ray Review: C.T. Abdomen/Pelvis: Reviewed Films.  Bone Scan: Reviewed Films.     10/24/19  PSA  Total PSA 21.90 ng/mL   Notes:                     CLINICAL DATA: Prostate cancer diagnosed 12/21/2019. Elevated PSA.  Smoker. Appendectomy.   EXAM:  CT ABDOMEN AND PELVIS WITH CONTRAST   TECHNIQUE:  Multidetector CT imaging of the abdomen and pelvis was performed  using the standard protocol following bolus administration of  intravenous contrast.   CONTRAST: 100 cc Omnipaque 300   COMPARISON: 06/10/2016.   FINDINGS:  Lower chest: Clear lung bases. Normal heart size without pericardial  or pleural effusion.   Hepatobiliary: Enlargement of a segment 2-3 2.2 cm hepatic cyst.  Other hepatic lesions are too small to characterize but similar.  Normal gallbladder, without biliary ductal dilatation.   Pancreas: Normal, without mass or ductal dilatation.   Spleen: Normal in size, without focal abnormality.   Adrenals/Urinary Tract: Normal adrenal glands. Normal kidneys,  without hydronephrosis. Normal urinary bladder.   Stomach/Bowel: Proximal gastric underdistention. Normal colon and  terminal ileum. Appendectomy. Normal small bowel.   Vascular/Lymphatic: Aortic atherosclerosis. No abdominopelvic  adenopathy.   Reproductive: Normal sized prostate. Symmetric seminal vesicles.  Nonspecific subtle heterogeneous hyperenhancement in the left apex  on 73/2.   Other: Tiny periumbilical fat containing ventral wall hernia.   Musculoskeletal: No acute osseous abnormality. Transitional S1  vertebral body.   IMPRESSION:  1. No acute process or evidence of  metastatic disease in the abdomen  or pelvis.  2. Aortic Atherosclerosis (ICD10-I70.0).    Electronically Signed  By: Abigail Miyamoto M.D.  On: 01/25/2020 16:53   CLINICAL DATA: Prostate carcinoma   EXAM:  NUCLEAR MEDICINE WHOLE BODY BONE SCAN   TECHNIQUE:  Whole body anterior and posterior images were obtained approximately  3 hours after intravenous injection of radiopharmaceutical.   RADIOPHARMACEUTICALS: 56.4 millicurie mCi [id number] MDP IV   COMPARISON: None.   FINDINGS:  There is no evidence of bony metastatic disease. There is probable  arthropathic change in the shoulders. Kidneys noted in the flank  positions bilaterally.   IMPRESSION:  No evident bony metastatic disease.    Electronically Signed  By: Lowella Grip III M.D.  On: 01/26/2020 11:13   PROCEDURES:          Urinalysis Dipstick Dipstick Cont'd  Color: Yellow Bilirubin: Neg mg/dL  Appearance: Clear Ketones: Neg mg/dL  Specific Gravity: 1.030 Blood: Neg ery/uL  pH: 5.5 Protein: Neg mg/dL  Glucose: Neg mg/dL Urobilinogen: 0.2 mg/dL    Nitrites: Neg    Leukocyte Esterase: Neg leu/uL    ASSESSMENT:      ICD-10  Details  1 GU:   Prostate Cancer - C61    PLAN:             1. High-risk prostate cancer: I had a detailed discussion with Mr. Centrella he today. He has numerous very good questions today and we reviewed his situation in detail including his risk stratification and his options for management. I did recommend therapy of curative intent considering his high risk disease. We specifically discussed his elevated PSA. This had been repeated and remained elevated. He understands that we do not have a great explanation for his PSA. We also discussed his exam which does raise some additional concerns today with nodularity toward the left lateral apex.   The patient was counseled about the natural history of prostate cancer and the standard treatment options that are available for prostate cancer.  It was explained to him how his age and life expectancy, clinical stage, Gleason score, and PSA affect his prognosis, the decision to proceed with additional staging studies, as well as how that information influences recommended treatment strategies. We discussed the roles for active surveillance, radiation therapy, surgical therapy, androgen deprivation, as well as ablative therapy options for the treatment of prostate cancer as appropriate to his individual cancer situation. We discussed the risks and benefits of these options with regard to their impact on cancer control and also in terms of potential adverse events, complications, and impact on quality of life particularly related to urinary and sexual function. The patient was encouraged to ask questions throughout the discussion today and all questions were answered to his stated satisfaction. In addition, the patient was provided with and/or directed to appropriate resources and literature for further education about prostate cancer and treatment options. We discussed surgical therapy for prostate cancer including the different available surgical approaches. We discussed, in detail, the risks and expectations of surgery with regard to cancer control, urinary control, and erectile function as well as the expected postoperative recovery process. Additional risks of surgery including but not limited to bleeding, infection, hernia formation, nerve damage, lymphocele formation, bowel/rectal injury potentially necessitating colostomy, damage to the urinary tract resulting in urine leakage, urethral stricture, and the cardiopulmonary risks such as myocardial infarction, stroke, death, venothromboembolism, etc. were explained. The risk of open surgical conversion for robotic/laparoscopic prostatectomy was also discussed.   He is well informed through his prior discussion with Dr. Tammi Klippel and is going to follow up with Dr. Lovena Neighbours to review his options. He appears  to be leaning toward surgical therapy at this time.  Tentatively, I would plan to perform robot assisted laparoscopic radical prostatectomy and bilateral pelvic lymphadenectomy with decisions regarding nerve sparing pending his MRI should he choose to proceed in this fashion.   Cc: Dr. Kathyrn Lass  Dr. Ellison Hughs  Dr. Tyler Pita          APPENDED NOTES:  Pt has made the decision to proceed with surgery. Will plan to schedule.

## 2020-03-14 ENCOUNTER — Observation Stay (HOSPITAL_COMMUNITY)
Admission: RE | Admit: 2020-03-14 | Discharge: 2020-03-15 | Disposition: A | Discharge status: Home / Self Care | Payer: 59 | Source: Ambulatory Visit | Attending: Urology | Admitting: Urology

## 2020-03-14 ENCOUNTER — Encounter (HOSPITAL_COMMUNITY)
Admission: RE | Disposition: A | Discharge status: Home / Self Care | Payer: Self-pay | Source: Ambulatory Visit | Attending: Urology

## 2020-03-14 ENCOUNTER — Encounter (HOSPITAL_COMMUNITY): Payer: Self-pay | Admitting: Urology

## 2020-03-14 ENCOUNTER — Ambulatory Visit (HOSPITAL_COMMUNITY): Payer: 59 | Admitting: Certified Registered"

## 2020-03-14 ENCOUNTER — Other Ambulatory Visit: Payer: Self-pay

## 2020-03-14 DIAGNOSIS — C61 Malignant neoplasm of prostate: Secondary | ICD-10-CM | POA: Diagnosis not present

## 2020-03-14 DIAGNOSIS — F17210 Nicotine dependence, cigarettes, uncomplicated: Secondary | ICD-10-CM | POA: Insufficient documentation

## 2020-03-14 HISTORY — PX: LYMPHADENECTOMY: SHX5960

## 2020-03-14 HISTORY — PX: ROBOT ASSISTED LAPAROSCOPIC RADICAL PROSTATECTOMY: SHX5141

## 2020-03-14 LAB — HEMOGLOBIN AND HEMATOCRIT, BLOOD
HCT: 40.5 % (ref 39.0–52.0)
Hemoglobin: 13 g/dL (ref 13.0–17.0)

## 2020-03-14 LAB — TYPE AND SCREEN
ABO/RH(D): O POS
Antibody Screen: NEGATIVE

## 2020-03-14 LAB — MRSA PCR SCREENING: MRSA by PCR: NEGATIVE

## 2020-03-14 LAB — ABO/RH: ABO/RH(D): O POS

## 2020-03-14 SURGERY — XI ROBOTIC ASSISTED LAPAROSCOPIC RADICAL PROSTATECTOMY LEVEL 2
Anesthesia: General

## 2020-03-14 MED ORDER — LIDOCAINE 2% (20 MG/ML) 5 ML SYRINGE
INTRAMUSCULAR | Status: DC | PRN
  Administered 2020-03-14: 80 mg via INTRAVENOUS

## 2020-03-14 MED ORDER — STERILE WATER FOR IRRIGATION IR SOLN
Status: DC | PRN
  Administered 2020-03-14: 1000 mL

## 2020-03-14 MED ORDER — SODIUM CHLORIDE 0.9 % IR SOLN
Status: DC | PRN
  Administered 2020-03-14: 1000 mL via INTRAVESICAL

## 2020-03-14 MED ORDER — INFLUENZA VAC SPLIT QUAD 0.5 ML IM SUSY: 0.5000 mL | PREFILLED_SYRINGE | INTRAMUSCULAR | Status: DC

## 2020-03-14 MED ORDER — LACTATED RINGERS IV SOLN
INTRAVENOUS | Status: DC | PRN
  Administered 2020-03-14: 1000 mL

## 2020-03-14 MED ORDER — KETOROLAC TROMETHAMINE 15 MG/ML IJ SOLN
15.0000 mg | Freq: Four times a day (QID) | INTRAMUSCULAR | Status: DC
  Administered 2020-03-14 – 2020-03-15 (×3): 15 mg via INTRAVENOUS
  Filled 2020-03-14 (×3): qty 1

## 2020-03-14 MED ORDER — LORATADINE 10 MG PO TABS
10.0000 mg | ORAL_TABLET | Freq: Every day | ORAL | Status: DC
  Filled 2020-03-14: qty 1

## 2020-03-14 MED ORDER — ONDANSETRON HCL 4 MG/2ML IJ SOLN
INTRAMUSCULAR | Status: DC | PRN
  Administered 2020-03-14: 4 mg via INTRAVENOUS

## 2020-03-14 MED ORDER — CEFAZOLIN SODIUM-DEXTROSE 2-4 GM/100ML-% IV SOLN
2.0000 g | Freq: Once | INTRAVENOUS | Status: AC
  Administered 2020-03-14: 2 g via INTRAVENOUS
  Filled 2020-03-14: qty 100

## 2020-03-14 MED ORDER — FENTANYL CITRATE (PF) 100 MCG/2ML IJ SOLN
INTRAMUSCULAR | Status: AC
  Filled 2020-03-14: qty 2

## 2020-03-14 MED ORDER — CHLORHEXIDINE GLUCONATE 0.12 % MT SOLN
15.0000 mL | Freq: Once | OROMUCOSAL | Status: AC
  Administered 2020-03-14: 15 mL via OROMUCOSAL

## 2020-03-14 MED ORDER — EPHEDRINE SULFATE 50 MG/ML IJ SOLN
INTRAMUSCULAR | Status: DC | PRN
  Administered 2020-03-14: 10 mg via INTRAVENOUS

## 2020-03-14 MED ORDER — ONDANSETRON HCL 4 MG/2ML IJ SOLN: 4.0000 mg | INTRAMUSCULAR | Status: DC | PRN

## 2020-03-14 MED ORDER — EPHEDRINE 5 MG/ML INJ
INTRAVENOUS | Status: AC
  Filled 2020-03-14: qty 10

## 2020-03-14 MED ORDER — FENTANYL CITRATE (PF) 100 MCG/2ML IJ SOLN: 25.0000 ug | INTRAMUSCULAR | Status: DC | PRN

## 2020-03-14 MED ORDER — ZOLPIDEM TARTRATE 5 MG PO TABS: 5.0000 mg | ORAL_TABLET | Freq: Every evening | ORAL | Status: DC | PRN

## 2020-03-14 MED ORDER — ACETAMINOPHEN 325 MG PO TABS: 650.0000 mg | ORAL_TABLET | ORAL | Status: DC | PRN

## 2020-03-14 MED ORDER — PHENYLEPHRINE 40 MCG/ML (10ML) SYRINGE FOR IV PUSH (FOR BLOOD PRESSURE SUPPORT)
PREFILLED_SYRINGE | INTRAVENOUS | Status: DC | PRN
  Administered 2020-03-14: 40 ug via INTRAVENOUS
  Administered 2020-03-14: 80 ug via INTRAVENOUS
  Administered 2020-03-14: 120 ug via INTRAVENOUS
  Administered 2020-03-14 (×2): 80 ug via INTRAVENOUS

## 2020-03-14 MED ORDER — HEPARIN SODIUM (PORCINE) 1000 UNIT/ML IJ SOLN
INTRAMUSCULAR | Status: AC
  Filled 2020-03-14: qty 1

## 2020-03-14 MED ORDER — SUGAMMADEX SODIUM 200 MG/2ML IV SOLN
INTRAVENOUS | Status: DC | PRN
  Administered 2020-03-14: 150 mg via INTRAVENOUS

## 2020-03-14 MED ORDER — BUPIVACAINE-EPINEPHRINE (PF) 0.25% -1:200000 IJ SOLN
INTRAMUSCULAR | Status: AC
  Filled 2020-03-14: qty 30

## 2020-03-14 MED ORDER — DIPHENHYDRAMINE HCL 12.5 MG/5ML PO ELIX: 12.5000 mg | ORAL_SOLUTION | Freq: Four times a day (QID) | ORAL | Status: DC | PRN

## 2020-03-14 MED ORDER — BACITRACIN-NEOMYCIN-POLYMYXIN 400-5-5000 EX OINT: 1.0000 "application " | TOPICAL_OINTMENT | Freq: Three times a day (TID) | CUTANEOUS | Status: DC | PRN

## 2020-03-14 MED ORDER — FLEET ENEMA 7-19 GM/118ML RE ENEM: 1.0000 | ENEMA | Freq: Once | RECTAL | Status: DC

## 2020-03-14 MED ORDER — SODIUM CHLORIDE (PF) 0.9 % IJ SOLN
INTRAMUSCULAR | Status: AC
  Filled 2020-03-14: qty 10

## 2020-03-14 MED ORDER — ALBUMIN HUMAN 5 % IV SOLN: INTRAVENOUS | Status: DC | PRN

## 2020-03-14 MED ORDER — TRAMADOL HCL 50 MG PO TABS
50.0000 mg | ORAL_TABLET | Freq: Four times a day (QID) | ORAL | 0 refills | Status: DC | PRN
Start: 2020-03-14 — End: 2020-12-17

## 2020-03-14 MED ORDER — MIDAZOLAM HCL 2 MG/2ML IJ SOLN
INTRAMUSCULAR | Status: DC | PRN
  Administered 2020-03-14: 2 mg via INTRAVENOUS

## 2020-03-14 MED ORDER — MORPHINE SULFATE (PF) 4 MG/ML IV SOLN
2.0000 mg | INTRAVENOUS | Status: DC | PRN
  Administered 2020-03-14: 4 mg via INTRAVENOUS
  Administered 2020-03-14 (×2): 2 mg via INTRAVENOUS
  Filled 2020-03-14 (×3): qty 1

## 2020-03-14 MED ORDER — FENTANYL CITRATE (PF) 100 MCG/2ML IJ SOLN
INTRAMUSCULAR | Status: DC | PRN
  Administered 2020-03-14 (×4): 50 ug via INTRAVENOUS

## 2020-03-14 MED ORDER — DOCUSATE SODIUM 100 MG PO CAPS
100.0000 mg | ORAL_CAPSULE | Freq: Two times a day (BID) | ORAL | Status: DC
  Administered 2020-03-14 – 2020-03-15 (×2): 100 mg via ORAL
  Filled 2020-03-14 (×2): qty 1

## 2020-03-14 MED ORDER — MIDAZOLAM HCL 2 MG/2ML IJ SOLN
INTRAMUSCULAR | Status: AC
  Filled 2020-03-14: qty 2

## 2020-03-14 MED ORDER — CEFAZOLIN SODIUM-DEXTROSE 1-4 GM/50ML-% IV SOLN
1.0000 g | Freq: Three times a day (TID) | INTRAVENOUS | Status: AC
  Administered 2020-03-14 – 2020-03-15 (×2): 1 g via INTRAVENOUS
  Filled 2020-03-14 (×2): qty 50

## 2020-03-14 MED ORDER — BELLADONNA ALKALOIDS-OPIUM 16.2-60 MG RE SUPP: 1.0000 | Freq: Four times a day (QID) | RECTAL | Status: DC | PRN

## 2020-03-14 MED ORDER — BUPIVACAINE-EPINEPHRINE 0.25% -1:200000 IJ SOLN
INTRAMUSCULAR | Status: DC | PRN
  Administered 2020-03-14: 30 mL

## 2020-03-14 MED ORDER — KCL IN DEXTROSE-NACL 20-5-0.45 MEQ/L-%-% IV SOLN
INTRAVENOUS | Status: DC
  Filled 2020-03-14 (×3): qty 1000

## 2020-03-14 MED ORDER — LACTATED RINGERS IV SOLN: INTRAVENOUS | Status: DC

## 2020-03-14 MED ORDER — PNEUMOCOCCAL VAC POLYVALENT 25 MCG/0.5ML IJ INJ
0.5000 mL | INJECTION | INTRAMUSCULAR | Status: DC
  Filled 2020-03-14: qty 0.5

## 2020-03-14 MED ORDER — ROCURONIUM BROMIDE 10 MG/ML (PF) SYRINGE
PREFILLED_SYRINGE | INTRAVENOUS | Status: AC
  Filled 2020-03-14: qty 10

## 2020-03-14 MED ORDER — FLUTICASONE PROPIONATE 50 MCG/ACT NA SUSP
1.0000 | Freq: Every day | NASAL | Status: DC | PRN
  Filled 2020-03-14: qty 16

## 2020-03-14 MED ORDER — FENTANYL CITRATE (PF) 100 MCG/2ML IJ SOLN
25.0000 ug | INTRAMUSCULAR | Status: DC | PRN
  Administered 2020-03-14 (×3): 50 ug via INTRAVENOUS

## 2020-03-14 MED ORDER — SODIUM CHLORIDE 0.9 % IV BOLUS
1000.0000 mL | Freq: Once | INTRAVENOUS | Status: AC
  Administered 2020-03-14: 1000 mL via INTRAVENOUS

## 2020-03-14 MED ORDER — ROCURONIUM BROMIDE 10 MG/ML (PF) SYRINGE
PREFILLED_SYRINGE | INTRAVENOUS | Status: DC | PRN
  Administered 2020-03-14: 20 mg via INTRAVENOUS
  Administered 2020-03-14 (×2): 10 mg via INTRAVENOUS
  Administered 2020-03-14: 70 mg via INTRAVENOUS

## 2020-03-14 MED ORDER — DIPHENHYDRAMINE HCL 50 MG/ML IJ SOLN: 12.5000 mg | Freq: Four times a day (QID) | INTRAMUSCULAR | Status: DC | PRN

## 2020-03-14 MED ORDER — MAGNESIUM CITRATE PO SOLN: 1.0000 | Freq: Once | ORAL | Status: DC

## 2020-03-14 MED ORDER — MUPIROCIN 2 % EX OINT
1.0000 "application " | TOPICAL_OINTMENT | Freq: Two times a day (BID) | CUTANEOUS | Status: DC
  Administered 2020-03-15: 1 via NASAL

## 2020-03-14 MED ORDER — ALBUMIN HUMAN 5 % IV SOLN
INTRAVENOUS | Status: AC
  Filled 2020-03-14: qty 250

## 2020-03-14 MED ORDER — SULFAMETHOXAZOLE-TRIMETHOPRIM 800-160 MG PO TABS: 1.0000 | ORAL_TABLET | Freq: Two times a day (BID) | ORAL | 0 refills | Status: DC

## 2020-03-14 MED ORDER — PROPOFOL 10 MG/ML IV BOLUS
INTRAVENOUS | Status: AC
  Filled 2020-03-14: qty 20

## 2020-03-14 MED ORDER — ORAL CARE MOUTH RINSE: 15.0000 mL | Freq: Once | OROMUCOSAL | Status: AC

## 2020-03-14 MED ORDER — PROPOFOL 10 MG/ML IV BOLUS
INTRAVENOUS | Status: DC | PRN
  Administered 2020-03-14: 200 mg via INTRAVENOUS

## 2020-03-14 MED ORDER — DEXAMETHASONE SODIUM PHOSPHATE 10 MG/ML IJ SOLN
INTRAMUSCULAR | Status: DC | PRN
  Administered 2020-03-14: 10 mg via INTRAVENOUS

## 2020-03-14 SURGICAL SUPPLY — 59 items
APPLICATOR COTTON TIP 6 STRL (MISCELLANEOUS) ×2 IMPLANT
APPLICATOR COTTON TIP 6IN STRL (MISCELLANEOUS) ×4
CATH FOLEY 2WAY SLVR 18FR 30CC (CATHETERS) ×4 IMPLANT
CATH ROBINSON RED A/P 16FR (CATHETERS) ×4 IMPLANT
CATH ROBINSON RED A/P 8FR (CATHETERS) ×4 IMPLANT
CATH TIEMANN FOLEY 18FR 5CC (CATHETERS) ×4 IMPLANT
CHLORAPREP W/TINT 26 (MISCELLANEOUS) ×4 IMPLANT
CLIP VESOLOCK LG 6/CT PURPLE (CLIP) ×8 IMPLANT
COVER SURGICAL LIGHT HANDLE (MISCELLANEOUS) ×4 IMPLANT
COVER TIP SHEARS 8 DVNC (MISCELLANEOUS) ×2 IMPLANT
COVER TIP SHEARS 8MM DA VINCI (MISCELLANEOUS) ×2
COVER WAND RF STERILE (DRAPES) IMPLANT
CUTTER ECHEON FLEX ENDO 45 340 (ENDOMECHANICALS) ×4 IMPLANT
DECANTER SPIKE VIAL GLASS SM (MISCELLANEOUS) ×4 IMPLANT
DERMABOND ADVANCED (GAUZE/BANDAGES/DRESSINGS) ×2
DERMABOND ADVANCED .7 DNX12 (GAUZE/BANDAGES/DRESSINGS) ×2 IMPLANT
DRAIN CHANNEL RND F F (WOUND CARE) IMPLANT
DRAPE ARM DVNC X/XI (DISPOSABLE) ×8 IMPLANT
DRAPE COLUMN DVNC XI (DISPOSABLE) ×2 IMPLANT
DRAPE DA VINCI XI ARM (DISPOSABLE) ×8
DRAPE DA VINCI XI COLUMN (DISPOSABLE) ×2
DRAPE SURG IRRIG POUCH 19X23 (DRAPES) ×4 IMPLANT
DRSG TEGADERM 4X4.75 (GAUZE/BANDAGES/DRESSINGS) ×4 IMPLANT
ELECT PENCIL ROCKER SW 15FT (MISCELLANEOUS) ×4 IMPLANT
ELECT REM PT RETURN 15FT ADLT (MISCELLANEOUS) ×4 IMPLANT
GLOVE BIO SURGEON STRL SZ 6.5 (GLOVE) ×3 IMPLANT
GLOVE BIO SURGEONS STRL SZ 6.5 (GLOVE) ×1
GLOVE BIOGEL M STRL SZ7.5 (GLOVE) ×8 IMPLANT
GOWN STRL REUS W/TWL LRG LVL3 (GOWN DISPOSABLE) ×12 IMPLANT
HOLDER FOLEY CATH W/STRAP (MISCELLANEOUS) ×4 IMPLANT
IRRIG SUCT STRYKERFLOW 2 WTIP (MISCELLANEOUS) ×4
IRRIGATION SUCT STRKRFLW 2 WTP (MISCELLANEOUS) ×2 IMPLANT
IV LACTATED RINGERS 1000ML (IV SOLUTION) ×4 IMPLANT
KIT TURNOVER KIT A (KITS) IMPLANT
NDL SAFETY ECLIPSE 18X1.5 (NEEDLE) ×2 IMPLANT
NEEDLE HYPO 18GX1.5 SHARP (NEEDLE) ×2
PACK ROBOT UROLOGY CUSTOM (CUSTOM PROCEDURE TRAY) ×4 IMPLANT
PENCIL SMOKE EVACUATOR (MISCELLANEOUS) IMPLANT
SEAL CANN UNIV 5-8 DVNC XI (MISCELLANEOUS) ×8 IMPLANT
SEAL XI 5MM-8MM UNIVERSAL (MISCELLANEOUS) ×8
SET TUBE SMOKE EVAC HIGH FLOW (TUBING) ×4 IMPLANT
SOLUTION ELECTROLUBE (MISCELLANEOUS) ×4 IMPLANT
STAPLE RELOAD 45 GRN (STAPLE) ×2 IMPLANT
STAPLE RELOAD 45MM GREEN (STAPLE) ×2
SUT ETHILON 3 0 PS 1 (SUTURE) ×4 IMPLANT
SUT MNCRL 3 0 RB1 (SUTURE) ×2 IMPLANT
SUT MNCRL 3 0 VIOLET RB1 (SUTURE) ×2 IMPLANT
SUT MNCRL AB 4-0 PS2 18 (SUTURE) ×8 IMPLANT
SUT MONOCRYL 3 0 RB1 (SUTURE) ×4
SUT VIC AB 0 CT1 27 (SUTURE) ×2
SUT VIC AB 0 CT1 27XBRD ANTBC (SUTURE) ×2 IMPLANT
SUT VIC AB 0 UR5 27 (SUTURE) ×4 IMPLANT
SUT VIC AB 2-0 SH 27 (SUTURE) ×2
SUT VIC AB 2-0 SH 27X BRD (SUTURE) ×2 IMPLANT
SUT VICRYL 0 UR6 27IN ABS (SUTURE) ×8 IMPLANT
SYR 27GX1/2 1ML LL SAFETY (SYRINGE) ×4 IMPLANT
TOWEL OR NON WOVEN STRL DISP B (DISPOSABLE) ×4 IMPLANT
TROCAR XCEL NON-BLD 5MMX100MML (ENDOMECHANICALS) IMPLANT
WATER STERILE IRR 1000ML POUR (IV SOLUTION) ×4 IMPLANT

## 2020-03-14 NOTE — Op Note (Signed)
Preoperative diagnosis: Clinically localized adenocarcinoma of the prostate (clinical stage T2a N0 M0)  Postoperative diagnosis: Clinically localized adenocarcinoma of the prostate (clinical stage T2a N0 M0)  Procedure:  1. Robotic assisted laparoscopic radical prostatectomy (bilateral nerve sparing) 2. Bilateral robotic assisted laparoscopic pelvic lymphadenectomy  Surgeon: [name]. M.D.  Assistant: Debbrah Alar, PA-C  An assistant was required for this surgical procedure.  The duties of the assistant included but were not limited to suctioning, passing suture, camera manipulation, retraction. This procedure would not be able to be performed without an Environmental consultant.  Anesthesia: General  Complications: None  EBL: 75 mL  IVF:  1200 mL crystalloid  Specimens: 1. Prostate and seminal vesicles 2. Right pelvic lymph nodes 3. Left pelvic lymph nodes  Disposition of specimens: Pathology  Drains: 1. 20 Fr coude catheter 2. # 19 Blake pelvic drain  Indication: Ryan Manning is a 49 y.o. year old patient with clinically localized prostate cancer.  After a thorough review of the management options for treatment of prostate cancer, he elected to proceed with surgical therapy and the above procedure(s).  We have discussed the potential benefits and risks of the procedure, side effects of the proposed treatment, the likelihood of the patient achieving the goals of the procedure, and any potential problems that might occur during the procedure or recuperation. Informed consent has been obtained.  Description of procedure:  The patient was taken to the operating room and a general anesthetic was administered. He was given preoperative antibiotics, placed in the dorsal lithotomy position, and prepped and draped in the usual sterile fashion. Next a preoperative timeout was performed. A urethral catheter was placed into the bladder and a site was selected near the umbilicus for  placement of the camera port. This was placed using a standard open Hassan technique which allowed entry into the peritoneal cavity under direct vision and without difficulty. An 8 mm robotic port was placed and a pneumoperitoneum established. The camera was then used to inspect the abdomen and there was no evidence of any intra-abdominal injuries or other abnormalities. The remaining abdominal ports were then placed. 8 mm robotic ports were placed in the right lower quadrant, left lower quadrant, and far left lateral abdominal wall. A 5 mm port was placed in the right upper quadrant and a 12 mm port was placed in the right lateral abdominal wall for laparoscopic assistance. All ports were placed under direct vision without difficulty. The surgical cart was then docked.   Utilizing the cautery scissors, the bladder was reflected posteriorly allowing entry into the space of Retzius and identification of the endopelvic fascia and prostate. The periprostatic fat was then removed from the prostate allowing full exposure of the endopelvic fascia. The endopelvic fascia was then incised from the apex back to the base of the prostate bilaterally and the underlying levator muscle fibers were swept laterally off the prostate thereby isolating the dorsal venous complex. The dorsal vein was then stapled and divided with a 45 mm Flex Echelon stapler. Attention then turned to the bladder neck which was divided anteriorly thereby allowing entry into the bladder and exposure of the urethral catheter. The catheter balloon was deflated and the catheter was brought into the operative field and used to retract the prostate anteriorly. The posterior bladder neck was then examined and was divided allowing further dissection between the bladder and prostate posteriorly until the vasa deferentia and seminal vessels were identified. The vasa deferentia were isolated, divided, and lifted anteriorly. The  seminal vesicles were dissected down  to their tips with care to control the seminal vascular arterial blood supply. These structures were then lifted anteriorly and the space between Denonvillier's fascia and the anterior rectum was developed with a combination of sharp and blunt dissection. This isolated the vascular pedicles of the prostate.  The lateral prostatic fascia was then sharply incised allowing release of the neurovascular bundles bilaterally. The vascular pedicles of the prostate were then ligated with Weck clips between the prostate and neurovascular bundles and divided with sharp cold scissor dissection resulting in neurovascular bundle preservation. The neurovascular bundles were then separated off the apex of the prostate and urethra bilaterally.  The urethra was then sharply transected allowing the prostate specimen to be disarticulated. The pelvis was copiously irrigated and hemostasis was ensured. There was no evidence for rectal injury.  Attention then turned to the right pelvic sidewall. The fibrofatty tissue between the external iliac vein, confluence of the iliac vessels, hypogastric artery, and Cooper's ligament was dissected free from the pelvic sidewall with care to preserve the obturator nerve. Weck clips were used for lymphostasis and hemostasis. An identical procedure was performed on the contralateral side and the lymphatic packets were removed for permanent pathologic analysis.  Attention then turned to the urethral anastomosis. A 2-0 Vicryl slip knot was placed between Denonvillier's fascia, the posterior bladder neck, and the posterior urethra to reapproximate these structures. A double-armed 3-0 Monocryl suture was then used to perform a 360 running tension-free anastomosis between the bladder neck and urethra. A new urethral catheter was then placed into the bladder and irrigated. There were no blood clots within the bladder and the anastomosis appeared to be watertight. A #19 Blake drain was then brought  through the left lateral 8 mm port site and positioned appropriately within the pelvis. It was secured to the skin with a nylon suture. The surgical cart was then undocked. The right lateral 12 mm port site was closed at the fascial level with a 0 Vicryl suture placed laparoscopically. All remaining ports were then removed under direct vision. The prostate specimen was removed intact within the Endopouch retrieval bag via the periumbilical camera port site. This fascial opening was closed with two running 0 Vicryl sutures. 0.25% Marcaine was then injected into all port sites and all incisions were reapproximated at the skin level with 4-0 Monocryl subcuticular sutures and Dermabond. The patient appeared to tolerate the procedure well and without complications. The patient was able to be extubated and transferred to the recovery unit in satisfactory condition.   [name] MD

## 2020-03-14 NOTE — Anesthesia Preprocedure Evaluation (Addendum)
Anesthesia Evaluation  Patient identified by MRN, date of birth, ID band  Reviewed: NPO status , Patient's Chart, lab work & pertinent test results  Airway Mallampati: II  TM Distance: >3 FB     Dental   Pulmonary Current Smoker and Patient abstained from smoking.,    breath sounds clear to auscultation       Cardiovascular negative cardio ROS   Rhythm:Regular Rate:Normal     Neuro/Psych    GI/Hepatic negative GI ROS, Neg liver ROS,   Endo/Other    Renal/GU negative Renal ROS     Musculoskeletal   Abdominal   Peds  Hematology   Anesthesia Other Findings   Reproductive/Obstetrics                           Anesthesia Physical Anesthesia Plan  ASA: II  Anesthesia Plan: General   Post-op Pain Management:    Induction: Intravenous  PONV Risk Score and Plan: 2 and Ondansetron, Dexamethasone and Midazolam  Airway Management Planned: Oral ETT  Additional Equipment:   Intra-op Plan:   Post-operative Plan: Extubation in OR  Informed Consent: I have reviewed the patients History and Physical, chart, labs and discussed the procedure including the risks, benefits and alternatives for the proposed anesthesia with the patient or authorized representative who has indicated his/her understanding and acceptance.     Dental advisory given  Plan Discussed with: Anesthesiologist and CRNA  Anesthesia Plan Comments:         Anesthesia Quick Evaluation

## 2020-03-14 NOTE — Anesthesia Procedure Notes (Signed)
Procedure Name: Intubation Date/Time: 03/14/2020 11:25 AM Performed by: Niel Hummer, CRNA Pre-anesthesia Checklist: Patient identified, Emergency Drugs available, Suction available and Patient being monitored Patient Re-evaluated:Patient Re-evaluated prior to induction Oxygen Delivery Method: Circle System Utilized Preoxygenation: Pre-oxygenation with 100% oxygen Induction Type: IV induction Ventilation: Mask ventilation without difficulty Laryngoscope Size: Miller and 2 Grade View: Grade I Tube type: Oral Number of attempts: 1 Airway Equipment and Method: Stylet and Oral airway Placement Confirmation: ETT inserted through vocal cords under direct vision,  positive ETCO2 and breath sounds checked- equal and bilateral Secured at: 22 cm Tube secured with: Tape Dental Injury: Teeth and Oropharynx as per pre-operative assessment  Comments: Placed by Hilda Blades

## 2020-03-14 NOTE — Discharge Instructions (Signed)

## 2020-03-14 NOTE — Transfer of Care (Signed)
Immediate Anesthesia Transfer of Care Note  Patient: Ryan Manning  Procedure(s) Performed: XI ROBOTIC ASSISTED LAPAROSCOPIC RADICAL PROSTATECTOMY LEVEL 2 (N/A ) LYMPHADENECTOMY, PELVIC (Bilateral )  Patient Location: PACU  Anesthesia Type:General  Level of Consciousness: awake, alert  and oriented  Airway & Oxygen Therapy: Patient Spontanous Breathing and Patient connected to face mask oxygen  Post-op Assessment: Report given to RN, Post -op Vital signs reviewed and stable and Patient moving all extremities X 4  Post vital signs: Reviewed and stable  Last Vitals:  Vitals Value Taken Time  BP    Temp    Pulse    Resp    SpO2      Last Pain:  Vitals:   03/14/20 0944  TempSrc:   PainSc: 0-No pain         Complications: No complications documented.

## 2020-03-14 NOTE — Progress Notes (Signed)
Patient ID: Ryan Manning, male   DOB: 1971-04-15, 49 y.o.   MRN: [id number]  Post-op note  Subjective: The patient is doing well.  No complaints.  Objective: Vital signs in last 24 hours: Temp:  [96.3 F (35.7 C)-98 F (36.7 C)] 97.5 F (36.4 C) (11/18 1430) Pulse Rate:  [66-115] 89 (11/18 1515) Resp:  [11-22] 14 (11/18 1515) BP: (113-147)/(62-109) 126/80 (11/18 1515) SpO2:  [95 %-100 %] 98 % (11/18 1515) Weight:  [71.8 kg] 71.8 kg (11/18 0944)  Intake/Output from previous day: No intake/output data recorded. Intake/Output this shift: Total I/O In: 2550 [I.V.:1200; IV Piggyback:1350] Out: 75 [Blood:75]  Physical Exam:  General: Alert and oriented. Abdomen: Soft, Nondistended. Incisions: Clean and dry. GU: Urine pink and draining well.  Lab Results: Recent Labs    03/14/20 1445  HGB 13.0  HCT 40.5    Assessment/Plan: POD#0   1) Continue to monitor, ambulate, IS   Ryan Manning. MD   LOS: 0 days   Ryan Manning 03/14/2020, 3:24 PM

## 2020-03-14 NOTE — Anesthesia Postprocedure Evaluation (Signed)
Anesthesia Post Note  Patient: Ryan Manning  Procedure(s) Performed: XI ROBOTIC ASSISTED LAPAROSCOPIC RADICAL PROSTATECTOMY LEVEL 2 (N/A ) LYMPHADENECTOMY, PELVIC (Bilateral )     Patient location during evaluation: PACU Anesthesia Type: General Level of consciousness: awake Pain management: pain level controlled Vital Signs Assessment: post-procedure vital signs reviewed and stable Respiratory status: spontaneous breathing Cardiovascular status: stable Postop Assessment: no apparent nausea or vomiting Anesthetic complications: no   No complications documented.  Last Vitals:  Vitals:   03/14/20 1400 03/14/20 1415  BP: (!) 147/100 (!) 126/96  Pulse: (!) 113 (!) 109  Resp: (!) 22 14  Temp: (!) 35.7 C (!) 36.2 C  SpO2: 99% 96%    Last Pain:  Vitals:   03/14/20 1351  TempSrc:   PainSc: 6                  Algernon Mundie

## 2020-03-15 ENCOUNTER — Encounter (HOSPITAL_COMMUNITY): Payer: Self-pay | Admitting: Urology

## 2020-03-15 DIAGNOSIS — C61 Malignant neoplasm of prostate: Secondary | ICD-10-CM | POA: Diagnosis not present

## 2020-03-15 LAB — HEMOGLOBIN AND HEMATOCRIT, BLOOD
HCT: 38.2 % — ABNORMAL LOW (ref 39.0–52.0)
Hemoglobin: 12.4 g/dL — ABNORMAL LOW (ref 13.0–17.0)

## 2020-03-15 MED ORDER — CHLORHEXIDINE GLUCONATE CLOTH 2 % EX PADS
6.0000 | MEDICATED_PAD | Freq: Every day | CUTANEOUS | Status: DC
  Administered 2020-03-15: 6 via TOPICAL

## 2020-03-15 MED ORDER — TRAMADOL HCL 50 MG PO TABS
50.0000 mg | ORAL_TABLET | Freq: Four times a day (QID) | ORAL | Status: DC | PRN
  Administered 2020-03-15 (×2): 50 mg via ORAL
  Filled 2020-03-15 (×2): qty 1

## 2020-03-15 MED ORDER — BISACODYL 10 MG RE SUPP
10.0000 mg | Freq: Once | RECTAL | Status: AC
  Administered 2020-03-15: 10 mg via RECTAL
  Filled 2020-03-15: qty 1

## 2020-03-15 NOTE — Progress Notes (Signed)
JP drain removed without complication. 4x4 dressing applied. Will continue to monitor.

## 2020-03-15 NOTE — Plan of Care (Signed)
  Problem: Skin Integrity: Goal: Demonstration of wound healing without infection will improve Outcome: Progressing   Problem: Education: Goal: Knowledge of the procedure and recovery process will improve Outcome: Adequate for Discharge   Problem: Bowel/Gastric: Goal: Gastrointestinal status for postoperative course will improve Outcome: Adequate for Discharge   Problem: Pain Management: Goal: General experience of comfort will improve Outcome: Adequate for Discharge   Problem: Urinary Elimination: Goal: Ability to avoid or minimize complications of infection will improve Outcome: Adequate for Discharge   Problem: Education: Goal: Knowledge of General Education information will improve Description: Including pain rating scale, medication(s)/side effects and non-pharmacologic comfort measures Outcome: Completed/Met

## 2020-03-15 NOTE — Progress Notes (Signed)
Patient ID: Ryan Manning, male   DOB: 1971/03/19, 49 y.o.   MRN: [id number]  1 Day Post-Op Subjective: The patient is doing well.  No nausea or vomiting. Pain is adequately controlled.  Objective: Vital signs in last 24 hours: Temp:  [96.3 F (35.7 C)-98.6 F (37 C)] 98 F (36.7 C) (11/19 0610) Pulse Rate:  [65-115] 68 (11/19 0610) Resp:  [11-22] 18 (11/19 0610) BP: (113-147)/(62-109) 113/82 (11/19 0610) SpO2:  [95 %-100 %] 99 % (11/19 0610) Weight:  [71.7 kg-71.8 kg] 71.7 kg (11/18 1658)  Intake/Output from previous day: 11/18 0701 - 11/19 0700 In: 6031.5 [P.O.:600; I.V.:2917.5; IV Piggyback:2449] Out: 9373 [Urine:1150; Drains:130; Blood:75] Intake/Output this shift: Total I/O In: 2386.6 [P.O.:600; I.V.:1686.6; IV Piggyback:100] Out: 980 [Urine:850; Drains:130]  Physical Exam:  General: Alert and oriented. CV: RRR Lungs: Clear bilaterally. GI: Soft, Nondistended. Incisions: Clean, dry, and intact Urine: Clear Extremities: Nontender, no erythema, no edema.  Lab Results: Recent Labs    03/14/20 1445  HGB 13.0  HCT 40.5      Assessment/Plan: POD# 1 s/p robotic prostatectomy.  1) SL IVF 2) Ambulate, Incentive spirometry 3) Transition to oral pain medication 4) Dulcolax suppository 5) D/C pelvic drain 6) Plan for likely discharge later today   Ryan Manning. MD   LOS: 0 days   Ryan Manning 03/15/2020, 6:57 AM

## 2020-03-15 NOTE — Plan of Care (Signed)
  Problem: Skin Integrity: Goal: Demonstration of wound healing without infection will improve Outcome: Progressing   Problem: Urinary Elimination: Goal: Ability to avoid or minimize complications of infection will improve 03/15/2020 1138 by Lennie Hummer, RN Outcome: Progressing 03/15/2020 1136 by Lennie Hummer, RN Outcome: Adequate for Discharge Goal: Ability to achieve and maintain urine output will improve Outcome: Progressing   Problem: Health Behavior/Discharge Planning: Goal: Ability to manage health-related needs will improve Outcome: Progressing   Problem: Clinical Measurements: Goal: Will remain free from infection Outcome: Progressing   Problem: Bowel/Gastric: Goal: Gastrointestinal status for postoperative course will improve Outcome: Adequate for Discharge

## 2020-03-15 NOTE — Discharge Summary (Signed)
  Date of admission: 03/14/2020  Date of discharge: 03/15/2020  Admission diagnosis: Prostate Cancer  Discharge diagnosis: Prostate Cancer  History and Physical: For full details, please see admission history and physical. Briefly, Ryan Manning is a 49 y.o. gentleman with localized prostate cancer.  After discussing management/treatment options, he elected to proceed with surgical treatment.  Hospital Course: Ryan Manning was taken to the operating room on 03/14/2020 and underwent a robotic assisted laparoscopic radical prostatectomy. He tolerated this procedure well and without complications. Postoperatively, he was able to be transferred to a regular hospital room following recovery from anesthesia.  He was able to begin ambulating the night of surgery. He remained hemodynamically stable overnight.  He had excellent urine output with appropriately minimal output from his pelvic drain and his pelvic drain was removed on POD #1.  He was transitioned to oral pain medication, tolerated a clear liquid diet, and had met all discharge criteria and was able to be discharged home later on POD#1.  Laboratory values: Recent Labs    03/14/20 1445 03/15/20 0651  HGB 13.0 12.4*  HCT 40.5 38.2*    Disposition: Home  Discharge instruction: He was instructed to be ambulatory but to refrain from heavy lifting, strenuous activity, or driving. He was instructed on urethral catheter care.  Discharge medications:   Allergies as of 03/15/2020      Reactions   Other Hives, Itching   Tree nuts       Medication List    STOP taking these medications   ARNICA EX   ibuprofen 200 MG tablet Commonly known as: ADVIL     TAKE these medications   cetirizine 10 MG tablet Commonly known as: ZYRTEC Take 10 mg by mouth daily as needed for allergies.   fluticasone 50 MCG/ACT nasal spray Commonly known as: FLONASE Place 1 spray into both nostrils daily as needed for allergies or rhinitis.   naftifine 1  % cream Commonly known as: NAFTIN Apply 1 application topically daily as needed (fungus).   sulfamethoxazole-trimethoprim 800-160 MG tablet Commonly known as: BACTRIM DS Take 1 tablet by mouth 2 (two) times daily. Start the day prior to foley removal appointment   SYSTANE OP Place 1 drop into both eyes daily as needed (irritation).   traMADol 50 MG tablet Commonly known as: Ultram Take 1-2 tablets (50-100 mg total) by mouth every 6 (six) hours as needed for moderate pain or severe pain.       Followup: He will followup in 1 week for catheter removal and to discuss his surgical pathology results.

## 2020-03-22 LAB — SURGICAL PATHOLOGY

## 2020-09-19 ENCOUNTER — Encounter: Payer: Self-pay | Admitting: *Deleted

## 2020-09-20 ENCOUNTER — Telehealth: Payer: Self-pay | Admitting: Neurology

## 2020-09-20 ENCOUNTER — Ambulatory Visit: Payer: 59 | Admitting: Neurology

## 2020-09-20 NOTE — Telephone Encounter (Signed)
He cancelled his 1130am appointment, at [facility] May 27th.   Please count as no show for this appt,

## 2020-12-17 ENCOUNTER — Encounter: Payer: Self-pay | Admitting: Neurology

## 2020-12-17 ENCOUNTER — Ambulatory Visit: Payer: 59 | Admitting: Neurology

## 2020-12-17 VITALS — BP 123/80 | HR 72 | Ht 70.0 in | Wt 168.0 lb

## 2020-12-17 DIAGNOSIS — G2581 Restless legs syndrome: Secondary | ICD-10-CM

## 2020-12-17 DIAGNOSIS — R202 Paresthesia of skin: Secondary | ICD-10-CM | POA: Diagnosis not present

## 2020-12-17 MED ORDER — GABAPENTIN 100 MG PO CAPS: 300.0000 mg | ORAL_CAPSULE | Freq: Every day | ORAL | 11 refills | Status: DC
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
## 2020-12-20 LAB — HIV ANTIBODY (ROUTINE TESTING W REFLEX): HIV Screen 4th Generation wRfx: NONREACTIVE

## 2020-12-20 LAB — RPR: RPR Ser Ql: NONREACTIVE

## 2020-12-20 LAB — MULTIPLE MYELOMA PANEL, SERUM
Albumin SerPl Elph-Mcnc: 4.2 g/dL (ref 2.9–4.4)
Albumin/Glob SerPl: 1.5 (ref 0.7–1.7)
Alpha 1: 0.2 g/dL (ref 0.0–0.4)
Alpha2 Glob SerPl Elph-Mcnc: 0.7 g/dL (ref 0.4–1.0)
B-Globulin SerPl Elph-Mcnc: 0.9 g/dL (ref 0.7–1.3)
Gamma Glob SerPl Elph-Mcnc: 1 g/dL (ref 0.4–1.8)
Globulin, Total: 2.9 g/dL (ref 2.2–3.9)
IgA/Immunoglobulin A, Serum: 169 mg/dL (ref 90–386)
IgG (Immunoglobin G), Serum: 1067 mg/dL (ref 603–1613)
IgM (Immunoglobulin M), Srm: 160 mg/dL (ref 20–172)
Total Protein: 7.1 g/dL (ref 6.0–8.5)

## 2020-12-20 LAB — METHYLMALONIC ACID, SERUM: Methylmalonic Acid: 221 nmol/L (ref 0–378)

## 2020-12-20 LAB — C-REACTIVE PROTEIN: CRP: 3 mg/L (ref 0–10)

## 2020-12-20 LAB — VITAMIN B12: Vitamin B-12: 500 pg/mL (ref 232–1245)

## 2020-12-20 LAB — LYME DISEASE SEROLOGY W/REFLEX: Lyme Total Antibody EIA: NEGATIVE

## 2020-12-20 LAB — FERRITIN: Ferritin: 116 ng/mL (ref 30–400)

## 2020-12-20 LAB — FOLATE: Folate: 8.3 ng/mL (ref >3.0)

## 2020-12-20 LAB — SEDIMENTATION RATE: Sed Rate: 16 mm/h (ref 0–30)

## 2020-12-20 LAB — ANA W/REFLEX IF POSITIVE: Anti Nuclear Antibody (ANA): NEGATIVE

## 2021-02-26 ENCOUNTER — Encounter: Payer: 59 | Admitting: Neurology

## 2021-04-30 ENCOUNTER — Ambulatory Visit: Payer: 59 | Admitting: Neurology

## 2021-04-30 ENCOUNTER — Other Ambulatory Visit: Payer: Self-pay

## 2021-04-30 ENCOUNTER — Ambulatory Visit (INDEPENDENT_AMBULATORY_CARE_PROVIDER_SITE_OTHER): Payer: 59 | Admitting: Neurology

## 2021-04-30 DIAGNOSIS — G2581 Restless legs syndrome: Secondary | ICD-10-CM | POA: Diagnosis not present

## 2021-04-30 DIAGNOSIS — R202 Paresthesia of skin: Secondary | ICD-10-CM
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
## 2021-04-30 NOTE — Procedures (Signed)
° ° ° °   °  Full Name: Ryan Manning Gender: Male MRN #: [id number] Date of Birth: 05/18/1970    Visit Date: 04/30/2021 09:31 Age: 52 Years Examining Physician: Marcial Pacas, MD  Referring Physician: Marcial Pacas, MD Height: 5 feet 10 inch Patient History: 168lbs History: 51 year old male, with history of alcohol use, presenting with bilateral feet paresthesia  Summary of the test: Nerve conduction study: Bilateral superficial, sural sensory responses were normal.  Left peroneal, tibial motor responses were normal  Electromyography:  Selected needle examination of left lower extremity muscles were normal.   Conclusion:  This is a normal study.  There is no electrodiagnostic evidence of large fiber peripheral neuropathy.  Above findings could not rule out a possibility of small fiber neuropathy.       ------------------------------- Marcial Pacas M.D. PhD  [facility] [address], [place], [place] [address] Tel: [phone] Fax: [phone]  Verbal informed consent was obtained from the patient, patient was informed of potential risk of procedure, including bruising, bleeding, hematoma formation, infection, muscle weakness, muscle pain, numbness, among others.        [place]    Nerve / Sites Muscle Latency Ref. Amplitude Ref. Rel Amp Segments Distance Velocity Ref. Area    ms ms mV mV %  cm m/s m/s mVms  L Peroneal - EDB     Ankle EDB 4.5 ?6.5 2.7 ?2.0 100 Ankle - EDB 9   7.8     Fib head EDB 10.5  2.2  82.4 Fib head - Ankle 30 49 ?44 7.3     Pop fossa EDB 12.6  2.1  96.1 Pop fossa - Fib head 10 48 ?44 6.6         Pop fossa - Ankle      L Tibial - AH     Ankle AH 4.6 ?5.8 6.4 ?4.0 100 Ankle - AH 9   22.6     Pop fossa AH 13.5  6.1  94.6 Pop fossa - Ankle 41 46 ?41 22.9         SNC    Nerve / Sites Rec. Site Peak Lat Ref.  Amp Ref. Segments Distance    ms ms V V  cm  L Sural - Ankle (Calf)     Calf Ankle 3.6 ?4.4 7 ?6 Calf - Ankle 14  R Sural -  Ankle (Calf)     Calf Ankle 3.6 ?4.4 7 ?6 Calf - Ankle 14  L Superficial peroneal - Ankle     Lat leg Ankle 3.5 ?4.4 7 ?6 Lat leg - Ankle 14  R Superficial peroneal - Ankle     Lat leg Ankle 3.0 ?4.4 6 ?6 Lat leg - Ankle 14             F  Wave    Nerve F Lat Ref.   ms ms  L Tibial - AH 55.2 ?56.0       EMG Summary Table    Spontaneous MUAP Recruitment  Muscle IA Fib PSW Fasc Other Amp Dur. Poly Pattern  L. Tibialis anterior Normal None None None _______ Normal Normal Normal Normal  L. Tibialis posterior Normal None None None _______ Normal Normal Normal Normal  L. Peroneus longus Normal None None None _______ Normal Normal Normal Normal  L. Vastus lateralis Normal None None None _______ Normal Normal Normal Normal  L. Gastrocnemius (Medial head) Normal None None None _______ Normal Normal Normal Normal

## 2021-07-23 DIAGNOSIS — L814 Other melanin hyperpigmentation: Secondary | ICD-10-CM | POA: Diagnosis not present

## 2021-07-23 DIAGNOSIS — L718 Other rosacea: Secondary | ICD-10-CM | POA: Diagnosis not present

## 2021-07-23 DIAGNOSIS — L821 Other seborrheic keratosis: Secondary | ICD-10-CM | POA: Diagnosis not present

## 2021-07-23 DIAGNOSIS — L570 Actinic keratosis: Secondary | ICD-10-CM | POA: Diagnosis not present

## 2021-07-23 DIAGNOSIS — D225 Melanocytic nevi of trunk: Secondary | ICD-10-CM | POA: Diagnosis not present

## 2021-09-22 IMAGING — CT CT CARDIAC CORONARY ARTERY CALCIUM SCORE
3 series · 13 of 20 positions shown, 15 images · non-contrast
Comparison: None.

CLINICAL DATA: Family history of heart disease. Prior broken ribs
and punctured lung.

EXAM:
CT CARDIAC CORONARY ARTERY CALCIUM SCORE
TECHNIQUE: Non-contrast imaging through the heart was performed using
prospective ECG gating. Image post processing was performed on an
independent workstation, allowing for quantitative analysis of the
heart and coronary arteries. Note that this exam targets the heart
and the chest was not imaged in its entirety.

[Series 2: calcium scoring 2.00 qr36 bestdiast 68% hrt calciu · axial · 0.37mm/px · z∈[+1594,+1690]mm · 5 of 73 slices shown, 7 images]
[im 13/73  vessel]
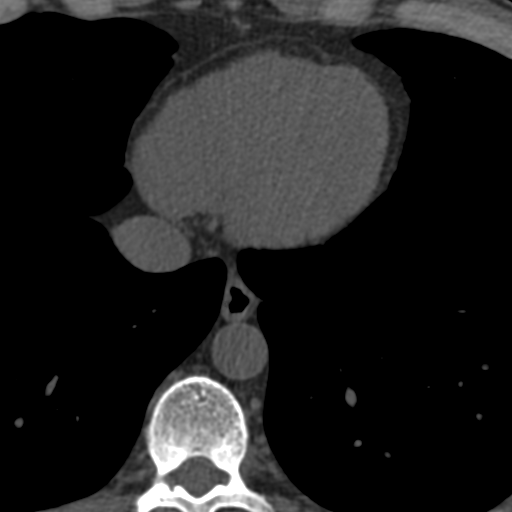
[im 13/73  lung]
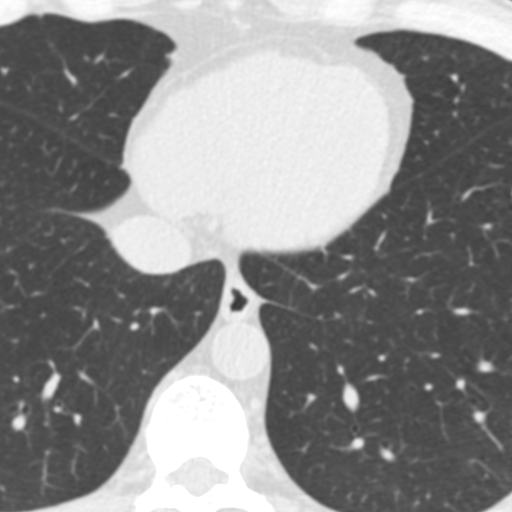
[im 25/73  vessel]
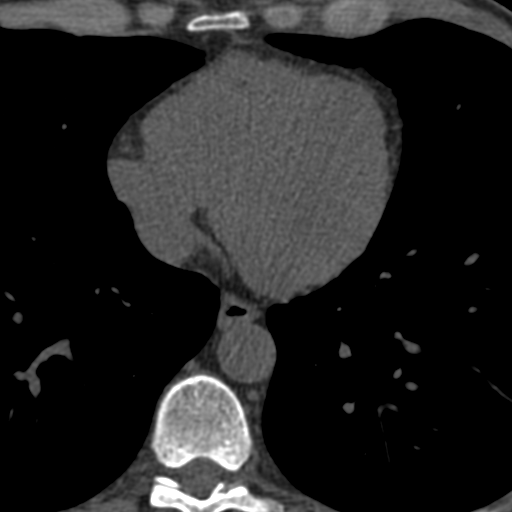
[im 37/73  vessel]
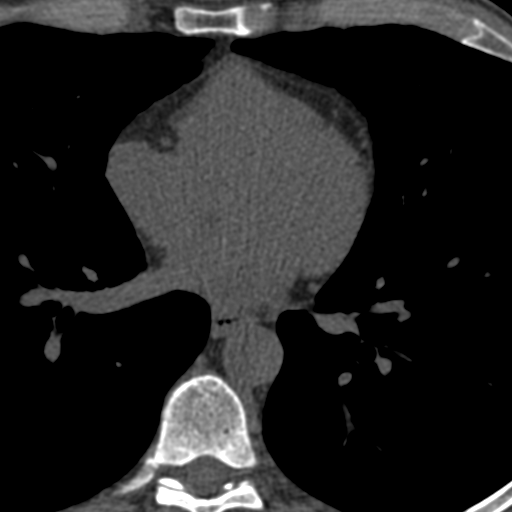
[im 49/73  vessel]
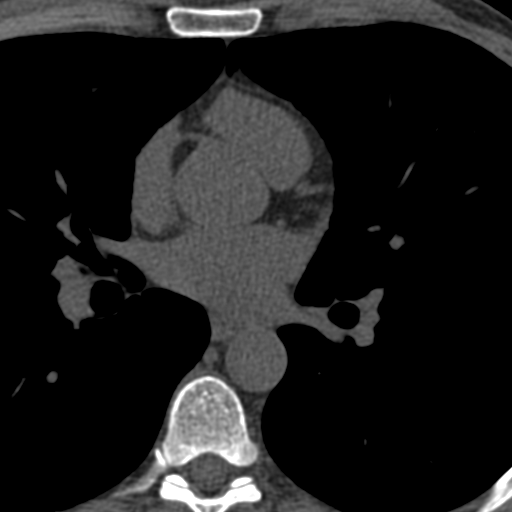
[im 61/73  vessel]
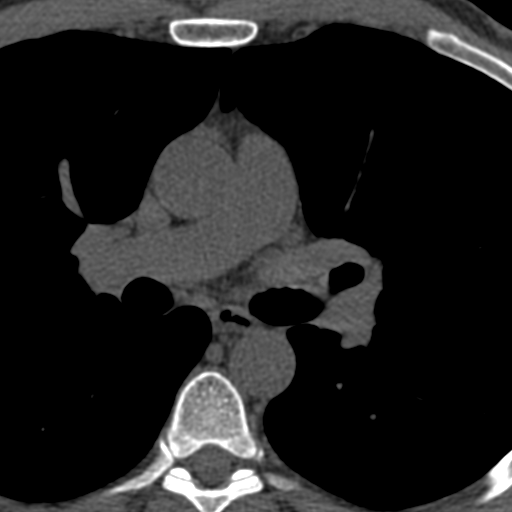
[im 61/73  lung]
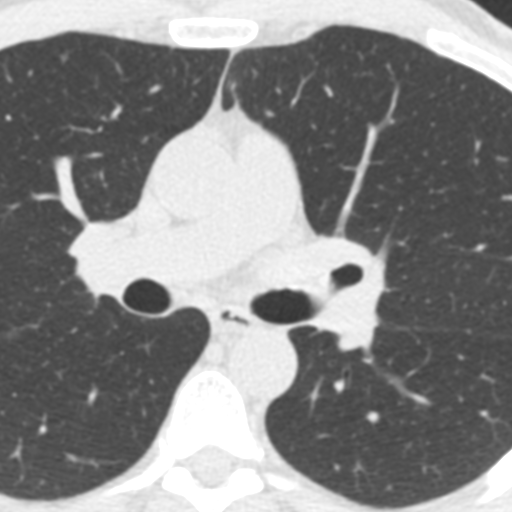

[Series 3: calcium scoring 2.00 br40 bestdiast 68% axial · axial · 0.50mm/px · z∈[+1583,+1687]mm · 5 of 80 slices shown]
[im 14/80  vessel]
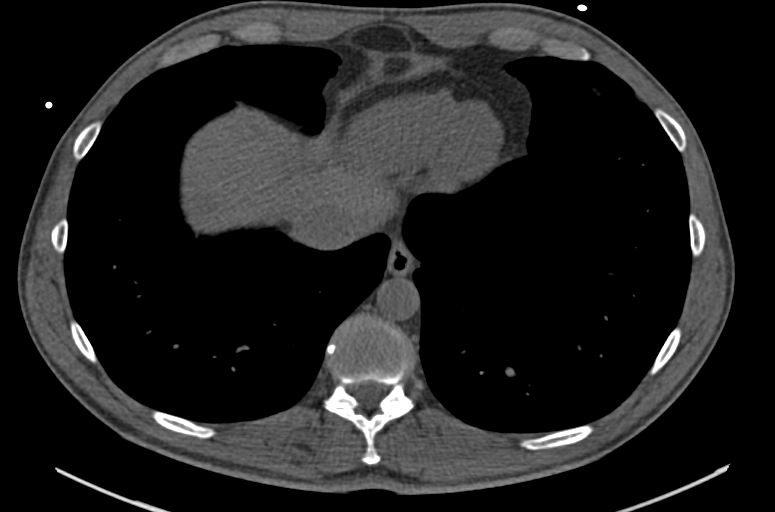
[im 27/80  vessel]
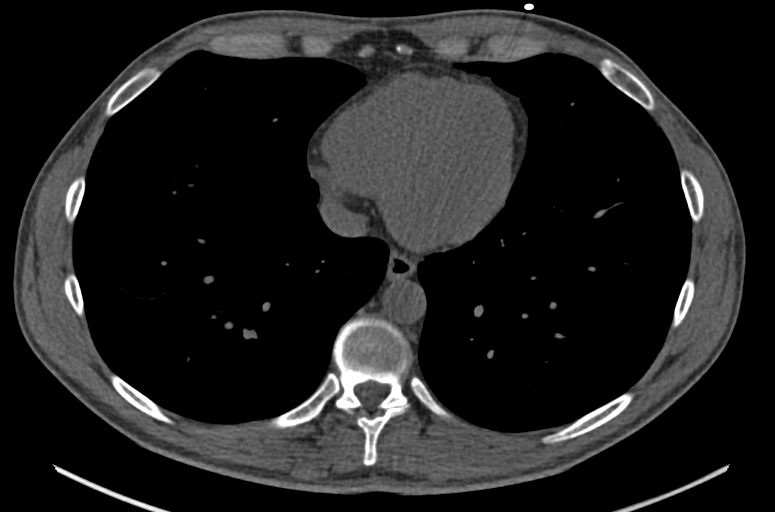
[im 40/80  vessel]
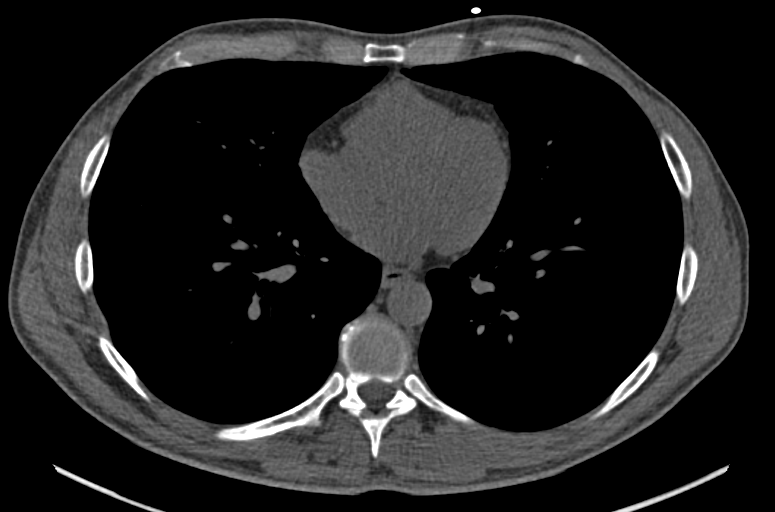
[im 53/80  vessel]
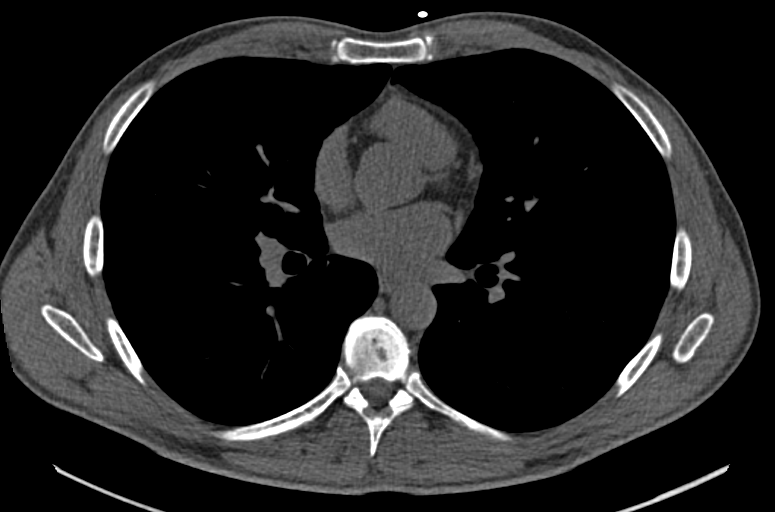
[im 66/80  vessel]
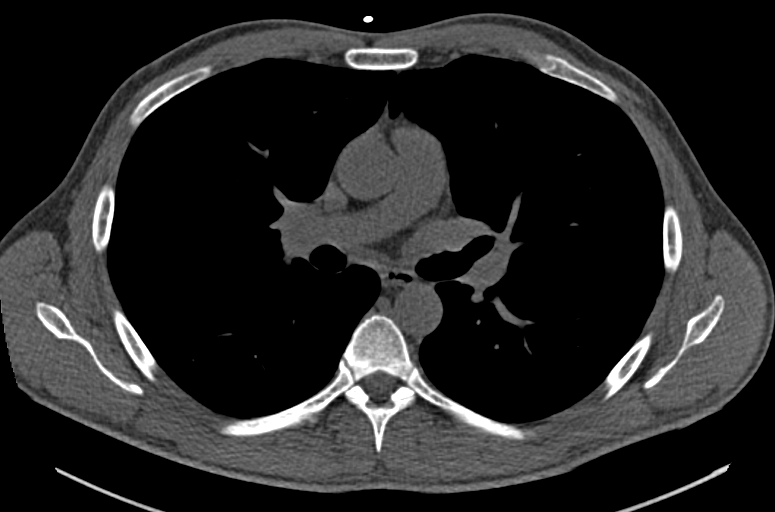

[Series 9: calcium scoring 2.00 br60 bestdiast 68% lungs · axial · 0.52mm/px · z∈[+1598,+1656]mm · 3 of 73 slices shown]
[im 15/73  vessel]
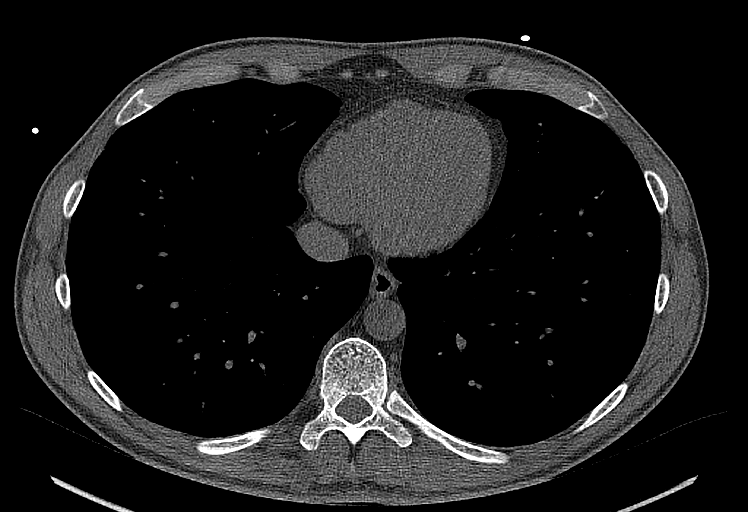
[im 29/73  vessel]
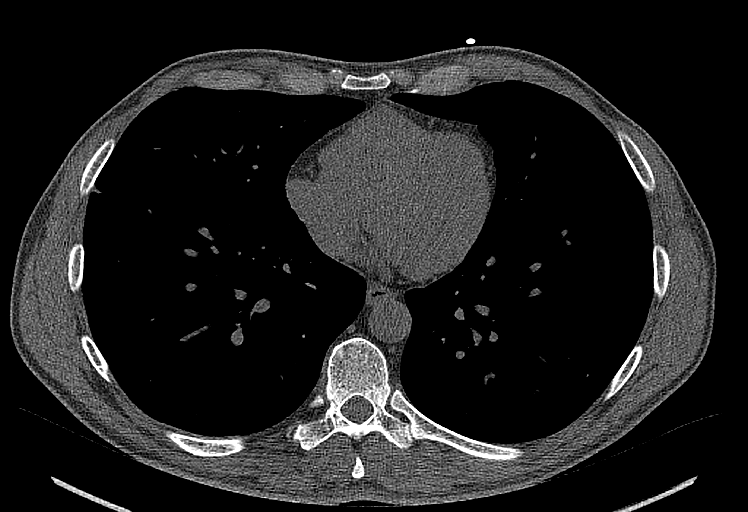
[im 44/73  vessel]
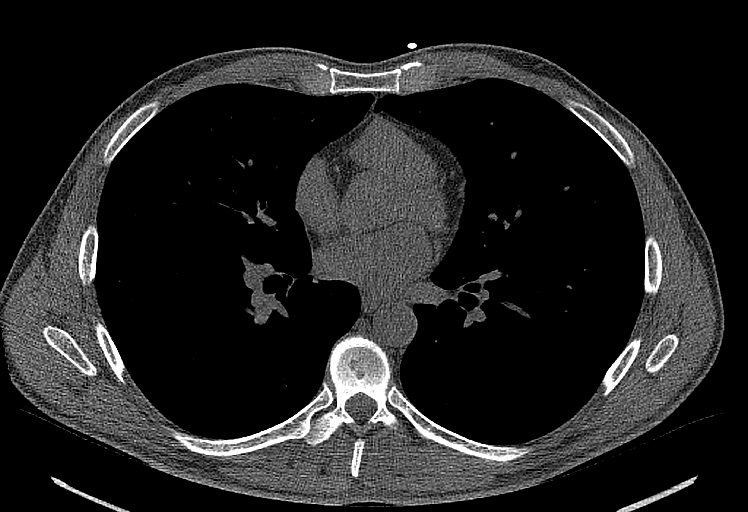

[13 of 20 positions shown; findings below may reference images not displayed]

FINDINGS: CORONARY CALCIUM SCORES:

Total Agatston Score: 0

AORTA MEASUREMENTS:

Ascending Aorta: 31 mm

Descending Aorta: 24 mm

OTHER FINDINGS:

Cardiovascular: Normal aortic caliber. Normal heart size, without
pericardial effusion.

Mediastinum/Nodes: No imaged thoracic adenopathy.

Lungs/Pleura: No pleural fluid. Mild centrilobular emphysema. Clear
imaged lungs.

Upper Abdomen: Normal imaged portions of the liver, spleen.

Musculoskeletal: No acute osseous abnormality.
IMPRESSION: 1. No coronary calcium identified.
2. No acute process in the imaged chest.
3. Emphysema ([id number]).

## 2021-11-12 DIAGNOSIS — E039 Hypothyroidism, unspecified: Secondary | ICD-10-CM | POA: Diagnosis not present

## 2022-02-25 DIAGNOSIS — K639 Disease of intestine, unspecified: Secondary | ICD-10-CM | POA: Diagnosis not present

## 2022-02-25 DIAGNOSIS — Z86010 Personal history of colonic polyps: Secondary | ICD-10-CM | POA: Diagnosis not present

## 2022-02-25 DIAGNOSIS — K625 Hemorrhage of anus and rectum: Secondary | ICD-10-CM | POA: Diagnosis not present

## 2022-02-25 DIAGNOSIS — K649 Unspecified hemorrhoids: Secondary | ICD-10-CM | POA: Diagnosis not present

## 2022-03-18 DIAGNOSIS — L300 Nummular dermatitis: Secondary | ICD-10-CM | POA: Diagnosis not present

## 2022-04-14 DIAGNOSIS — K648 Other hemorrhoids: Secondary | ICD-10-CM | POA: Diagnosis not present

## 2022-04-14 DIAGNOSIS — D125 Benign neoplasm of sigmoid colon: Secondary | ICD-10-CM | POA: Diagnosis not present

## 2022-04-14 DIAGNOSIS — D122 Benign neoplasm of ascending colon: Secondary | ICD-10-CM | POA: Diagnosis not present

## 2022-04-14 DIAGNOSIS — K625 Hemorrhage of anus and rectum: Secondary | ICD-10-CM | POA: Diagnosis not present

## 2022-04-14 DIAGNOSIS — K6289 Other specified diseases of anus and rectum: Secondary | ICD-10-CM | POA: Diagnosis not present

## 2022-07-14 DIAGNOSIS — L718 Other rosacea: Secondary | ICD-10-CM | POA: Diagnosis not present
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
## 2022-08-12 ENCOUNTER — Other Ambulatory Visit: Payer: Self-pay

## 2022-08-12 ENCOUNTER — Other Ambulatory Visit: Payer: Self-pay | Admitting: Internal Medicine

## 2022-08-12 ENCOUNTER — Ambulatory Visit: Payer: 59 | Admitting: Internal Medicine

## 2022-08-12 ENCOUNTER — Encounter: Payer: Self-pay | Admitting: Internal Medicine

## 2022-08-12 VITALS — BP 128/76 | HR 94 | Temp 98.7°F | Resp 18 | Ht 68.58 in | Wt 169.9 lb

## 2022-08-12 DIAGNOSIS — T781XXA Other adverse food reactions, not elsewhere classified, initial encounter: Secondary | ICD-10-CM | POA: Diagnosis not present

## 2022-08-12 DIAGNOSIS — L309 Dermatitis, unspecified: Secondary | ICD-10-CM | POA: Insufficient documentation

## 2022-08-12 DIAGNOSIS — J3089 Other allergic rhinitis: Secondary | ICD-10-CM | POA: Diagnosis not present

## 2022-08-12 DIAGNOSIS — J302 Other seasonal allergic rhinitis: Secondary | ICD-10-CM

## 2022-08-12 DIAGNOSIS — J310 Chronic rhinitis: Secondary | ICD-10-CM

## 2022-08-12 DIAGNOSIS — L308 Other specified dermatitis: Secondary | ICD-10-CM | POA: Diagnosis not present

## 2022-08-12 MED ORDER — FLUTICASONE PROPIONATE 50 MCG/ACT NA SUSP: 1.0000 | Freq: Every day | NASAL | 1 refills | Status: AC

## 2022-08-12 MED ORDER — PIMECROLIMUS 1 % EX CREA: TOPICAL_CREAM | Freq: Two times a day (BID) | CUTANEOUS | 0 refills | Status: AC

## 2022-08-12 MED ORDER — TRIAMCINOLONE ACETONIDE 0.1 % EX OINT: TOPICAL_OINTMENT | CUTANEOUS | 1 refills | Status: AC

## 2022-08-12 MED ORDER — EPINEPHRINE 0.3 MG/0.3ML IJ SOAJ: 0.3000 mg | INTRAMUSCULAR | 1 refills | Status: DC | PRN

## 2022-08-12 NOTE — Patient Instructions (Addendum)
Atopic Dermatitis:  Daily Care For Maintenance (daily and continue even once eczema controlled) - Use hypoallergenic hydrating ointment at least twice daily.  This must be done daily for control of flares. (Great options include Vaseline, CeraVe, Aquaphor, Aveeno, Cetaphil, VaniCream, etc) - Avoid detergents, soaps or lotions with fragrances/dyes - Limit showers/baths to 5 minutes and use luke warm water instead of hot, pat dry following baths, and apply moisturizer - can use steroid/non-steroid therapy creams as detailed below up to twice weekly for prevention of flares. - allergy testing today was positive - see below for allergen avoidance  For Flares:(add this to maintenance therapy if needed for flares) First apply steroid/non-steroid treatment creams. Wait 5 minutes then apply moisturizer.  - Triamcinolone 0.1% to body for moderate flares-apply topically twice daily to red, raised areas of skin, followed by moisturizer. Do NOT use on face, groin or armpits. - avoid topicals on face for now - Non-steroid treatment options:  Elidel 1% apply topically twice daily as needed (can use in place of steroid creams if desires)  Return in 2 weeks for patch testing.  No topicals until patch testing complete (must stop for 2 weeks prior to testing). No oral or systemic steroids for 4 weeks prior to patch testing. If no improvement with above, consider dupixent.  Chronic Rhinitis seasonal and perennial allergic: - allergy testing today was positive to ragweed, [place] grass, dust mites, cat; intradermals positive to johnson, grass mix, mold mix 2, mold mix 4, cockroach - allergen avoidance as below - Start Zyrtec (cetirizine)  10 mg   daily as needed. - Consider nasal saline rinses as needed to help remove pollens, mucus and hydrate nasal mucosa - Continue Flonase (fluticasone) 1 spray in each nostril daily  Best results if used daily.  Discontinue if recurrent nose bleeds. 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  - today's skin testing was negative to most common food allergens including tree nuts - labs today for tree nuts - please strictly avoid tree nuts until all results returned - okay to continue eating peanuts - for SKIN only reaction, okay to take Benadryl 2 capsules every 4 hours - for SKIN + ANY additional symptoms, OR IF concern for LIFE THREATENING reaction = Epipen Autoinjector EpiPen 0.3 mg. - If using Epinephrine autoinjector, call 911 - A food allergy action plan has been provided and discussed. - Medic Alert identification is recommended.  Follow up : 2 weeks for patch testing It was a pleasure meeting you in clinic today! Thank you for allowing me to participate in your care.  Tonny Bollman, MD [facility]   DUST MITE AVOIDANCE MEASURES:  There are three main measures that need and can be taken to avoid house dust mites:  Reduce accumulation of dust in general -reduce furniture, clothing, carpeting, books, stuffed animals, especially in bedroom  Separate yourself from the dust -use pillow and mattress encasements (can be found at stores such as [employer] or online) -avoid direct exposure to air condition flow -use a HEPA filter device, especially in the bedroom; you can also use a HEPA filter vacuum cleaner -wipe dust with a moist towel instead of a dry towel or broom when cleaning  Decrease mites and/or their secretions -wash clothing and linen and stuffed animals at highest temperature possible, at least every  2 weeks -stuffed animals can also be placed in a bag and put in a freezer overnight  Despite the above measures, it is impossible to eliminate dust mites or their allergen completely from your home.  With the above measures the burden of mites in your home can be diminished, with the  goal of minimizing your allergic symptoms.  Success will be reached only when implementing and using all means together. Reducing Pollen Exposure  The American Academy of Allergy, Asthma and Immunology suggests the following steps to reduce your exposure to pollen during allergy seasons.    Do not hang sheets or clothing out to dry; pollen may collect on these items. Do not mow lawns or spend time around freshly cut grass; mowing stirs up pollen. Keep windows closed at night.  Keep car windows closed while driving. Minimize morning activities outdoors, a time when pollen counts are usually at their highest. Stay indoors as much as possible when pollen counts or humidity is high and on windy days when pollen tends to remain in the air longer. Use air conditioning when possible.  Many air conditioners have filters that trap the pollen spores. Use a HEPA room air filter to remove pollen form the indoor air you breathe. Control of Dog or Cat Allergen  Avoidance is the best way to manage a dog or cat allergy. If you have a dog or cat and are allergic to dog or cats, consider removing the dog or cat from the home. If you have a dog or cat but don't want to find it a new home, or if your family wants a pet even though someone in the household is allergic, here are some strategies that may help keep symptoms at bay:  Keep the pet out of your bedroom and restrict it to only a few rooms. Be advised that keeping the dog or cat in only one room will not limit the allergens to that room. Don't pet, hug or kiss the dog or cat; if you do, wash your hands with soap and water. High-efficiency particulate air (HEPA) cleaners run continuously in a bedroom or living room can reduce allergen levels over time. Regular use of a high-efficiency vacuum cleaner or a central vacuum can reduce allergen levels. Giving your dog or cat a bath at least once a week can reduce airborne allergen. Control of Mold Allergen    Mold and fungi can grow on a variety of surfaces provided certain temperature and moisture conditions exist.  Outdoor molds grow on plants, decaying vegetation and soil.  The major outdoor mold, Alternaria and Cladosporium, are found in very high numbers during hot and dry conditions.  Generally, a late Summer - Fall peak is seen for common outdoor fungal spores.  Rain will temporarily lower outdoor mold spore count, but counts rise rapidly when the rainy period ends.  The most important indoor molds are Aspergillus and Penicillium.  Dark, humid and poorly ventilated basements are ideal sites for mold growth.  The next most common sites of mold growth are the bathroom and the kitchen.  Outdoor (Seasonal) Mold Control  Use air conditioning and keep windows closed Avoid exposure to decaying vegetation. Avoid leaf raking. Avoid grain handling. Consider wearing a face mask if working in moldy areas.    Indoor (Perennial) Mold Control   Maintain humidity below 50%. Clean washable surfaces with 5% bleach solution. Remove sources e.g. contaminated carpets.

## 2022-08-16 LAB — IGE NUT PROF. W/COMPONENT RFLX

## 2022-08-18 LAB — PANEL 604726
Cor A 1 IgE: <0.1 kU/L
Cor A 14 IgE: <0.1 kU/L
Cor A 8 IgE: <0.1 kU/L
Cor A 9 IgE: 0.38 kU/L — AB

## 2022-08-18 LAB — IGE NUT PROF. W/COMPONENT RFLX
F017-IgE Hazelnut (Filbert): 0.23 kU/L — AB
F018-IgE Brazil Nut: 0.13 kU/L — AB
F020-IgE Almond: 0.18 kU/L — AB
F202-IgE Cashew Nut: 0.18 kU/L — AB
F203-IgE Pistachio Nut: 0.22 kU/L — AB
F256-IgE Walnut: <0.1 kU/L
Macadamia Nut, IgE: 0.11 kU/L — AB
Peanut, IgE: <0.1 kU/L
Pecan Nut IgE: <0.1 kU/L

## 2022-08-18 LAB — ALLERGEN COMPONENT COMMENTS

## 2022-08-18 LAB — PANEL 604239: ANA O 3 IgE: <0.1 kU/L

## 2022-08-18 LAB — PANEL 604350: Ber E 1 IgE: <0.1 kU/L

## 2022-08-18 NOTE — Progress Notes (Signed)
Please let Ryan Manning know that his tree nut testing is below our threshold for positive. It sounds like based on his history that he is intolerant to nuts given his recent exposures and itching. He can continue to avoid. If he would like to try an oral challenge, I would be willing to challenge.

## 2022-08-24 ENCOUNTER — Encounter: Payer: Self-pay | Admitting: Family Medicine

## 2022-08-24 ENCOUNTER — Other Ambulatory Visit: Payer: Self-pay

## 2022-08-24 ENCOUNTER — Ambulatory Visit (INDEPENDENT_AMBULATORY_CARE_PROVIDER_SITE_OTHER): Payer: 59 | Admitting: Family Medicine

## 2022-08-24 VITALS — BP 116/72 | HR 75 | Temp 98.3°F | Ht 68.58 in | Wt 172.7 lb

## 2022-08-24 DIAGNOSIS — L235 Allergic contact dermatitis due to other chemical products: Secondary | ICD-10-CM

## 2022-08-24 DIAGNOSIS — L259 Unspecified contact dermatitis, unspecified cause: Secondary | ICD-10-CM | POA: Insufficient documentation

## 2022-08-24 NOTE — Progress Notes (Signed)
Follow-up Note  RE: Cormick Moss MRN: [id number] DOB: Jan 31, 1971 Date of Office Visit: 08/24/2022  Primary care provider: Sigmund Hazel, MD Referring provider: Sigmund Hazel, MD   Uziel returns to the office today for the patch test placement, given suspected history of contact dermatitis.    Diagnostics: True Test patches placed.   Plan:   Allergic contact dermatitis - Instructions provided on care of the patches for the next 48 hours. Aquarius Latouche was instructed to avoid showering for the next 48 hours. Fleet Higham will follow up in 48 hours and 96 hours for patch readings.     Call the clinic if this treatment plan is not working well for you  Follow up in 2 days or sooner if needed.  Thank you for the opportunity to care for this patient.  Please do not hesitate to contact me with questions.  Thermon Leyland, FNP [facility]

## 2022-08-24 NOTE — Patient Instructions (Addendum)
Diagnostics: True Test patches placed.   Plan:   Allergic contact dermatitis - Instructions provided on care of the patches for the next 48 hours. Ryan Manning was instructed to avoid showering for the next 48 hours. Ryan Manning will follow up in 48 hours and 96 hours for patch readings.    Call the clinic if this treatment plan is not working well for you  Follow up in 2 days or sooner if needed.

## 2022-08-25 NOTE — Progress Notes (Signed)
   Follow Up Note  RE: Kenton Fortin MRN: [id number] DOB: 11/21/1970 Date of Office Visit: 08/26/2022  Referring provider: Sigmund Hazel, MD Primary care provider: Sigmund Hazel, MD  History of Present Illness: I had the pleasure of seeing Thorvald Orsino for a follow up visit at the [facility] on 08/26/2022. He is a 52 y.o. male, who is being followed for concern for contact dermatitis. Today he is here for initial patch test interpretation, given suspected history of contact dermatitis.   Diagnostics:   TRUE TEST 48 hour reading:   T.R.U.E. Test - 08/26/22 1500       Test Information   Time Antigen Placed 1540    Reading Interval Day 1      Panel 1   1. Nickel Sulfate 0    2. Wool Alcohols 0    3. Neomycin Sulfate 0    4. Potassium Dichromate 2    5. Caine Mix 0    6. Fragrance Mix 0    7. Colophony 0    8. Paraben Mix 0    9. Negative Control 0    10. Balsam of [place] 0    11. Ethylenediamine Dihydrochloride 0    12. Cobalt Dichloride 1      Panel 2   13. p-tert Butylphenol Formaldehyde Resin 0    14. Epoxy Resin 0    15. Carba Mix 0    16.  Black Rubber Mix 0    17. Cl+ Me-Isothiazolinone 0    18. Quaternium-15 0    19. Methyldibromo Glutaronitrile 0    20. p-Phenylenediamine 0    21. Formaldehyde 0    22. Mercapto Mix 0    23. Thimerosal 0    24. Thiuram Mix 0   +/-     Panel 3   25. Diazolidinyl Urea 0    26. Quinoline Mix 1    27. Tixocortol-21-Pivalate 0    28. Gold Sodium Thiosulfate 1    29. Imidazolidinyl Urea 0    30. Budesonide 0    31. Hydrocortisone-17-Butyrate 0    32. Mercaptobenzothiazole 0    33. Bacitracin 0    34. Parthenolide 0    35. Disperse Blue 106 0    36. 2-Bromo-2-Nitropropane-1,3-diol 0              Assessment and Plan: Oshua is a 52 y.o. male with: Concern for Contact Dermatitis:  Skin care reviewed Patient to follow-up Friday for final read Return in about 2 days (around 08/28/2022).  It was my  pleasure to see Yaden today and participate in his care. Please feel free to contact me with any questions or concerns.  Sincerely,   Tonny Bollman, MD [facility]

## 2022-08-26 ENCOUNTER — Ambulatory Visit: Payer: 59 | Admitting: Internal Medicine

## 2022-08-26 ENCOUNTER — Encounter: Payer: Self-pay | Admitting: Internal Medicine

## 2022-08-26 DIAGNOSIS — L235 Allergic contact dermatitis due to other chemical products: Secondary | ICD-10-CM

## 2022-08-28 ENCOUNTER — Ambulatory Visit: Payer: 59 | Admitting: Internal Medicine

## 2022-08-28 DIAGNOSIS — L308 Other specified dermatitis: Secondary | ICD-10-CM

## 2022-08-28 DIAGNOSIS — L235 Allergic contact dermatitis due to other chemical products: Secondary | ICD-10-CM

## 2022-08-28 NOTE — Progress Notes (Signed)
   Follow Up Note  RE: Canio Contreraz MRN: [id number] DOB: 21-May-1970 Date of Office Visit: 08/28/2022  Referring provider: Sigmund Hazel, MD Primary care provider: Sigmund Hazel, MD  History of Present Illness: I had the pleasure of seeing Arin Tapscott for a follow up visit at the [facility] on 08/28/2022. He is a 52 y.o. male, who is being followed for contact dermatitis. Today he is here for final patch test interpretation, given suspected history of contact dermatitis.    Diagnostics:   TRUE TEST 96-hour hour reading:  1+ reaction to #5 (Caine mix) and 1+ reaction to #28 (Gold sodium thiosulfate)  Previously positive to potassium dichromate, cobalt dichloride, thiuram mix, quinoline mix, gold,     Plan:   Allergic contact dermatitis - The patient has been provided detailed information regarding the substances she is sensitive to, as well as products containing the substances.   - Meticulous avoidance of these substances is recommended.  - If avoidance is not possible, the use of barrier creams or lotions is recommended. - If symptoms persist or progress despite meticulous avoidance, Dermatology Referral may be warranted. - Do a daily soaking tub bath in warm water for 10-15 minutes.  - Use a gentle, unscented cleanser at the end of the bath (such as Dove unscented bar or baby wash, or Aveeno sensitive body wash). Then rinse, pat half-way dry, and apply a gentle, unscented moisturizer cream or ointment (Cerave, Cetaphil, Eucerin, Aveeno)  all over while still damp. Dry skin makes the itching and rash of eczema worse. The skin should be moisturized with a gentle, unscented moisturizer at least twice daily.  - Use only unscented liquid laundry detergent. - Apply prescribed topical steroid (triamcinolone 0.1% below neck or hydrocortisone 2.5% above neck) to flared areas (red and thickened eczema) after the moisturizer has soaked into the skin (wait at least 30 minutes).  Taper off the topical steroids as the skin improves. Do not use topical steroid for more than 7-10 days at a time.  - Put Elidel onto areas of rough eczema (that is not red) twice a day. May decrease to once a day as the eczema improves. This will not thin the skin, and is safe for chronic use. Do not put this onto normal appearing skin. - Sent CAMP list. Information sheet given for individual chemicals also.    It was my pleasure to see Kiyoshi today and participate in his care. Please feel free to contact me with any questions or concerns.  Sincerely,  Alesia Morin, MD [facility]

## 2022-09-28 NOTE — Progress Notes (Unsigned)
Follow Up Note  RE: Giovoni Hefferon MRN: [id number] DOB: July 23, 1970 Date of Office Visit: 09/30/2022  Referring provider: Sigmund Hazel, MD Primary care provider: Sigmund Hazel, MD  Chief Complaint:No chief complaint on file.  History of Present Illness: I had the pleasure of seeing Chess Trevett for a follow up visit at the [facility] on 09/28/2022. He is a 52 y.o. male, who is being followed for eczema, food allergy, medication allergies. His previous allergy office visit was on 08/28/22 with Dr. Maurine Minister. Today he is here for tree nut food challenge.   History of Reaction: Whole body itching, had scratch testing as a child which was positive Blisters on hands Improved after stopping tree nuts  Labs/skin testing: 08/12/22-negative to tree nuts 08/12/22-serum IGE nut panel negative  Component     Latest Ref Rng 08/12/2022  F017-IgE Hazelnut ([place])     Class 0/I kU/L 0.23 !   F256-IgE Walnut     Class 0 kU/L <0.10   F202-IgE Cashew Nut     Class 0/I kU/L 0.18 !   F018-IgE [place] Nut     Class 0/I kU/L 0.13 !   Peanut, IgE     Class 0 kU/L <0.10   Macadamia Nut, IgE     Class 0/I kU/L 0.11 !   Pecan Nut IgE     Class 0 kU/L <0.10   F203-IgE Pistachio Nut     Class 0/I kU/L 0.22 !   F020-IgE Almond     Class 0/I kU/L 0.18 !     Legend: ! Abnormal  Interval History: Patient has not been ill, he has not had any accidental exposures to the culprit food.   Recent/Current History: Pulmonary disease: {Blank single:19197::"yes","no"} Cardiac disease: {Blank single:19197::"yes","no"} Respiratory infection: {Blank single:19197::"yes","no"} Rash: {Blank single:19197::"yes","no"} Itch: {Blank single:19197::"yes","no"} Swelling: {Blank single:19197::"yes","no"} Cough: {Blank single:19197::"yes","no"} Shortness of breath: {Blank single:19197::"yes","no"} Runny/stuffy nose: {Blank single:19197::"yes","no"} Itchy eyes: {Blank  single:19197::"yes","no"} Beta-blocker use: {Blank single:19197::"yes","no","n/a"}  Patient/guardian was informed of the test procedure with verbalized understanding of the risk of anaphylaxis. Consent was signed.   Last antihistamine use: *** Last beta-blocker use: ***  Medication List:  Current Outpatient Medications  Medication Sig Dispense Refill   cetirizine (ZYRTEC) 10 MG tablet Take 10 mg by mouth daily as needed for allergies.      Cyanocobalamin (B-12) 2500 MCG SUBL      desonide (DESOWEN) 0.05 % ointment Apply topically 2 (two) times daily.     EPINEPHRINE 0.3 mg/0.3 mL IJ SOAJ injection INJECT 0.3 MG INTO THE MUSCLE AS NEEDED FOR ANAPHYLAXIS. 2 each 1   fluticasone (FLONASE) 50 MCG/ACT nasal spray Place 1 spray into both nostrils daily as needed for allergies or rhinitis.     fluticasone (FLONASE) 50 MCG/ACT nasal spray Place 1 spray into both nostrils daily. 16 g 1   pimecrolimus (ELIDEL) 1 % cream Apply topically 2 (two) times daily. 30 g 0   Polyethyl Glycol-Propyl Glycol (SYSTANE OP) Place 1 drop into both eyes daily as needed (irritation).     triamcinolone ointment (KENALOG) 0.1 % 1 application to red, raised areas 2 times daily as needed. Avoid face, groin, or armpits. 30 g 1   No current facility-administered medications for this visit.    Allergies: Allergies  Allergen Reactions   Other Hives and Itching    Tree nuts      Review of Systems-negative except as per HPI  Objective: There were no vitals taken for this visit. There is  no height or weight on file to calculate BMI.  General Appearance:  Alert, cooperative, no distress, appears stated age  Head:  Normocephalic, without obvious abnormality, atraumatic  Eyes:  Conjunctiva clear, EOM's intact  Nose: Nares normal  Throat: Lips, tongue normal; teeth and gums normal, *** posterior oropharnyx  Neck: Supple, symmetrical  Lungs:   ***, Respirations unlabored, no coughing  Heart:  ***, *** murmur,  Appears well perfused  Extremities: No edema  Skin: Skin color, texture, turgor normal, no rashes or lesions on visualized portions of skin  Neurologic: No gross deficits     Diagnostics: Spirometry:  Tracings reviewed. His effort: {Blank single:19197::"Good reproducible efforts.","It was hard to get consistent efforts and there is a question as to whether this reflects a maximal maneuver.","Poor effort, data can not be interpreted."} FVC: ***L FEV1: ***L, ***% predicted FEV1/FVC ratio: ***% Interpretation: {Blank single:19197::"Spirometry consistent with mild obstructive disease","Spirometry consistent with moderate obstructive disease","Spirometry consistent with severe obstructive disease","Spirometry consistent with possible restrictive disease","Spirometry consistent with mixed obstructive and restrictive disease","Spirometry uninterpretable due to technique","Spirometry consistent with normal pattern","No overt abnormalities noted given today's efforts"}.  Please see scanned spirometry results for details.  Skin Testing: {Blank single:19197::"None","Deferred due to recent antihistamines use"}. Positive test to: ***. Negative test to: ***.  Results discussed with patient/family.    Previous notes and tests were reviewed. The plan was reviewed with the patient/family, and all questions/concerned were addressed.  Assessment and Plan: Judas is a 52 y.o. male with:  {Blank single:19197::"Food Allergy to ***","Anaphylaxis to food-***","***"}   Challenge food: *** Challenge as per protocol: {Blank single:19197::"Passed","Failed"} Total time: ***  - Allergy list updated accordingly - Patient  {Blank single:19197::"no longer","still"} needs to carry an epinephrine autoinjector.   Do not eat challenge food for next 24 hours and monitor for hives, swelling, shortness of breath and dizziness. If you see these symptoms, use Benadryl for mild symptoms and epinephrine for more severe  symptoms and call 911.   It was my pleasure to see Manly today and participate in his care. Please feel free to contact me with any questions or concerns.  Sincerely,  Tonny Bollman, MD [facility]

## 2022-09-30 ENCOUNTER — Encounter: Payer: Self-pay | Admitting: Internal Medicine

## 2022-09-30 ENCOUNTER — Other Ambulatory Visit: Payer: Self-pay

## 2022-09-30 ENCOUNTER — Ambulatory Visit (INDEPENDENT_AMBULATORY_CARE_PROVIDER_SITE_OTHER): Payer: 59 | Admitting: Internal Medicine

## 2022-09-30 VITALS — BP 106/78 | HR 68 | Temp 98.4°F | Resp 18 | Ht 68.58 in | Wt 170.3 lb

## 2022-09-30 DIAGNOSIS — T7800XA Anaphylactic reaction due to unspecified food, initial encounter: Secondary | ICD-10-CM

## 2022-09-30 NOTE — Patient Instructions (Addendum)
Tree Nut Challenge PASSED!  Okay to eat tree nuts if desired. No need to carry epinephrine autoinjector.   Your risk of reaction is the same as that of the general public.

## 2022-11-17 DIAGNOSIS — Z0000 Encounter for general adult medical examination without abnormal findings: Secondary | ICD-10-CM | POA: Diagnosis not present

## 2022-11-17 DIAGNOSIS — K648 Other hemorrhoids: Secondary | ICD-10-CM | POA: Diagnosis not present

## 2022-11-17 DIAGNOSIS — Z6825 Body mass index (BMI) 25.0-25.9, adult: Secondary | ICD-10-CM | POA: Diagnosis not present

## 2022-11-17 DIAGNOSIS — R7303 Prediabetes: Secondary | ICD-10-CM | POA: Diagnosis not present

## 2022-11-17 DIAGNOSIS — Z86010 Personal history of colonic polyps: Secondary | ICD-10-CM | POA: Diagnosis not present

## 2022-11-17 DIAGNOSIS — I700 Atherosclerosis of aorta: Secondary | ICD-10-CM | POA: Diagnosis not present

## 2022-11-17 DIAGNOSIS — R946 Abnormal results of thyroid function studies: Secondary | ICD-10-CM | POA: Diagnosis not present

## 2022-11-17 DIAGNOSIS — Z8546 Personal history of malignant neoplasm of prostate: Secondary | ICD-10-CM | POA: Diagnosis not present

## 2022-11-17 DIAGNOSIS — Z23 Encounter for immunization: Secondary | ICD-10-CM | POA: Diagnosis not present

## 2022-12-08 DIAGNOSIS — Z713 Dietary counseling and surveillance: Secondary | ICD-10-CM | POA: Diagnosis not present

## 2022-12-18 DIAGNOSIS — C61 Malignant neoplasm of prostate: Secondary | ICD-10-CM | POA: Diagnosis not present

## 2022-12-22 DIAGNOSIS — C61 Malignant neoplasm of prostate: Secondary | ICD-10-CM | POA: Diagnosis not present

## 2022-12-22 DIAGNOSIS — N5201 Erectile dysfunction due to arterial insufficiency: Secondary | ICD-10-CM | POA: Diagnosis not present

## 2022-12-22 DIAGNOSIS — R3121 Asymptomatic microscopic hematuria: Secondary | ICD-10-CM | POA: Diagnosis not present

## 2024-02-16 DIAGNOSIS — Z1159 Encounter for screening for other viral diseases: Secondary | ICD-10-CM | POA: Diagnosis not present

## 2024-02-16 DIAGNOSIS — R109 Unspecified abdominal pain: Secondary | ICD-10-CM | POA: Diagnosis not present
